# Patient Record
Sex: Female | Born: 1960 | Hispanic: Yes | Smoking: Former smoker
Health system: Southern US, Community
[De-identification: ages and names within clinical notes are randomized; demographics above are authoritative.]

## PROBLEM LIST (undated history)

## (undated) DIAGNOSIS — I10 Essential (primary) hypertension: Secondary | ICD-10-CM

## (undated) DIAGNOSIS — K219 Gastro-esophageal reflux disease without esophagitis: Secondary | ICD-10-CM

## (undated) DIAGNOSIS — K589 Irritable bowel syndrome without diarrhea: Secondary | ICD-10-CM

## (undated) DIAGNOSIS — K449 Diaphragmatic hernia without obstruction or gangrene: Secondary | ICD-10-CM

## (undated) DIAGNOSIS — U071 COVID-19: Secondary | ICD-10-CM

## (undated) DIAGNOSIS — D649 Anemia, unspecified: Secondary | ICD-10-CM

## (undated) DIAGNOSIS — T7840XA Allergy, unspecified, initial encounter: Secondary | ICD-10-CM

## (undated) DIAGNOSIS — M1990 Unspecified osteoarthritis, unspecified site: Secondary | ICD-10-CM

## (undated) DIAGNOSIS — K8020 Calculus of gallbladder without cholecystitis without obstruction: Secondary | ICD-10-CM

## (undated) HISTORY — DX: Calculus of gallbladder without cholecystitis without obstruction: K80.20

## (undated) HISTORY — PX: ROOT CANAL: SHX2363

## (undated) HISTORY — DX: Diaphragmatic hernia without obstruction or gangrene: K44.9

## (undated) HISTORY — PX: ABDOMINAL HYSTERECTOMY: SHX81

## (undated) HISTORY — DX: Essential (primary) hypertension: I10

## (undated) HISTORY — DX: COVID-19: U07.1

## (undated) HISTORY — DX: Anemia, unspecified: D64.9

## (undated) HISTORY — PX: TUBAL LIGATION: SHX77

## (undated) HISTORY — DX: Unspecified osteoarthritis, unspecified site: M19.90

## (undated) HISTORY — DX: Irritable bowel syndrome, unspecified: K58.9

## (undated) HISTORY — PX: CARDIAC ELECTROPHYSIOLOGY STUDY AND ABLATION: SHX1294

## (undated) HISTORY — DX: Gastro-esophageal reflux disease without esophagitis: K21.9

## (undated) HISTORY — DX: Allergy, unspecified, initial encounter: T78.40XA

---

## 2007-06-13 HISTORY — PX: TOTAL ABDOMINAL HYSTERECTOMY W/ BILATERAL SALPINGOOPHORECTOMY: SHX83

## 2016-06-17 ENCOUNTER — Encounter (HOSPITAL_COMMUNITY): Payer: Self-pay | Admitting: *Deleted

## 2016-06-17 ENCOUNTER — Emergency Department (HOSPITAL_COMMUNITY): Payer: Self-pay

## 2016-06-17 DIAGNOSIS — Z79899 Other long term (current) drug therapy: Secondary | ICD-10-CM | POA: Insufficient documentation

## 2016-06-17 DIAGNOSIS — R1013 Epigastric pain: Secondary | ICD-10-CM | POA: Insufficient documentation

## 2016-06-17 DIAGNOSIS — R002 Palpitations: Secondary | ICD-10-CM | POA: Insufficient documentation

## 2016-06-17 LAB — CBC
HCT: 39.8 % (ref 36.0–46.0)
HEMOGLOBIN: 13.1 g/dL (ref 12.0–15.0)
MCH: 30.7 pg (ref 26.0–34.0)
MCHC: 32.9 g/dL (ref 30.0–36.0)
MCV: 93.2 fL (ref 78.0–100.0)
PLATELETS: 296 10*3/uL (ref 150–400)
RBC: 4.27 MIL/uL (ref 3.87–5.11)
RDW: 13.3 % (ref 11.5–15.5)
WBC: 11.2 10*3/uL — AB (ref 4.0–10.5)

## 2016-06-17 LAB — BASIC METABOLIC PANEL
ANION GAP: 8 (ref 5–15)
BUN: 16 mg/dL (ref 6–20)
CHLORIDE: 105 mmol/L (ref 101–111)
CO2: 25 mmol/L (ref 22–32)
Calcium: 9.1 mg/dL (ref 8.9–10.3)
Creatinine, Ser: 0.85 mg/dL (ref 0.44–1.00)
GFR calc Af Amer: >60 mL/min (ref >60)
GFR calc non Af Amer: >60 mL/min (ref >60)
Glucose, Bld: 107 mg/dL — ABNORMAL HIGH (ref 65–99)
Potassium: 3.9 mmol/L (ref 3.5–5.1)
SODIUM: 138 mmol/L (ref 135–145)

## 2016-06-17 LAB — I-STAT TROPONIN, ED: Troponin i, poc: 0 ng/mL (ref 0.00–0.08)

## 2016-06-17 NOTE — ED Triage Notes (Addendum)
Pt c/o palpitations and gas x 40 mins. Reports chest pressure that is relieved with belching. Pt also feels more fatigued than usual and having difficulty staying awake.  Hx of cardiac ablation in the past. HR 79

## 2016-06-18 ENCOUNTER — Emergency Department (HOSPITAL_COMMUNITY)
Admission: EM | Admit: 2016-06-18 | Discharge: 2016-06-18 | Disposition: A | Discharge status: Home / Self Care | Payer: Self-pay | Attending: Emergency Medicine | Admitting: Emergency Medicine

## 2016-06-18 DIAGNOSIS — R1013 Epigastric pain: Secondary | ICD-10-CM

## 2016-06-18 DIAGNOSIS — R002 Palpitations: Secondary | ICD-10-CM

## 2016-06-18 NOTE — ED Provider Notes (Signed)
[facility]-EMERGENCY DEPT Provider Note   CSN: [id number] Arrival date & time: 06/17/16  2235     History   Chief Complaint Chief Complaint  Patient presents with  . Palpitations    HPI Gail Turner is a 56 y.o. female.  Patient presents with sense of palpitations and mild SOB earlier this evening. No chest pain, nausea, cough, fever. She reports that since eating earlier she has had epigastric discomfort and excessive belching. She feels her symptoms of palpitations, and epigastric discomfort were improved when she belched. She was on Zegrid in the past but stopped taking it when she became asymptomatic, and that current symptoms are similar. She had an ablation in the past  And was concerned about her heart, prompting ED visit. She is not having any symptoms now and has not in the past 2 hours.   The history is provided by the patient. No language interpreter was used.  Palpitations   Associated symptoms include abdominal pain (See HPI.) and shortness of breath. Pertinent negatives include no fever, no nausea and no vomiting.    History reviewed. No pertinent past medical history.  There are no active problems to display for this patient.   Past Surgical History:  Procedure Laterality Date  . ABDOMINAL HYSTERECTOMY    . ABLATION    . CESAREAN SECTION      OB History    No data available       Home Medications    Prior to Admission medications   Medication Sig Start Date End Date Taking? Authorizing Provider  omega-3 acid ethyl esters (LOVAZA) 1 g capsule Take 1 g by mouth daily.   Yes Historical Provider, MD  Prenatal Vit-Fe Fumarate-FA (PRENATAL MULTIVITAMIN) TABS tablet Take 1 tablet by mouth daily at 12 noon.   Yes Historical Provider, MD    Family History No family history on file.  Social History Social History  Substance Use Topics  . Smoking status: Never Smoker  . Smokeless tobacco: Never Used  . Alcohol use Yes     Allergies   Patient has no  known allergies.   Review of Systems Review of Systems  Constitutional: Negative for chills and fever.  HENT: Negative.   Respiratory: Positive for shortness of breath.   Cardiovascular: Positive for palpitations.  Gastrointestinal: Positive for abdominal pain (See HPI.). Negative for nausea and vomiting.  Musculoskeletal: Negative.   Skin: Negative.   Neurological: Negative.      Physical Exam Updated Vital Signs BP 121/74   Pulse 61   Temp 97.6 F (36.4 C) (Oral)   Resp (!) 22   SpO2 94%   Physical Exam  Constitutional: She is oriented to person, place, and time. She appears well-developed and well-nourished.  HENT:  Head: Normocephalic.  Neck: Normal range of motion. Neck supple.  Cardiovascular: Normal rate and regular rhythm.   Pulmonary/Chest: Effort normal and breath sounds normal. She has no wheezes. She has no rales. She exhibits no tenderness.  Abdominal: Soft. Bowel sounds are normal. There is no tenderness. There is no rebound and no guarding.  Musculoskeletal: Normal range of motion. She exhibits no edema.  Neurological: She is alert and oriented to person, place, and time.  Skin: Skin is warm and dry. No rash noted.  Psychiatric: She has a normal mood and affect.     ED Treatments / Results  Labs (all labs ordered are listed, but only abnormal results are displayed) Labs Reviewed  BASIC METABOLIC PANEL - Abnormal; Notable for the  following:       Result Value   Glucose, Bld 107 (*)    All other components within normal limits  CBC - Abnormal; Notable for the following:    WBC 11.2 (*)    All other components within normal limits  I-STAT TROPOININ, ED    EKG  EKG Interpretation  Date/Time:  Friday June 17 2016 22:41:33 EDT Ventricular Rate:  78 PR Interval:  176 QRS Duration: 98 QT Interval:  406 QTC Calculation: 462 R Axis:   82 Text Interpretation:  Normal sinus rhythm Incomplete right bundle branch block Borderline ECG No previous ECGs  available Confirmed by Bebe Shaggy  MD, DONALD ([id number]) on 06/18/2016 1:55:17 AM       Radiology Dg Chest 2 View  Result Date: 06/17/2016 CLINICAL DATA:  Chest palpitations, pressure and gas. EXAM: CHEST  2 VIEW COMPARISON:  None. FINDINGS: The heart size and mediastinal contours are within normal limits. Both lungs are clear. The visualized skeletal structures are unremarkable. IMPRESSION: No active cardiopulmonary disease. Electronically Signed   By: Tollie Eth M.D.   On: 06/17/2016 22:57    Procedures Procedures (including critical care time)  Medications Ordered in ED Medications - No data to display   Initial Impression / Assessment and Plan / ED Course  I have reviewed the triage vital signs and the nursing notes.  Pertinent labs & imaging results that were available during my care of the patient were reviewed by me and considered in my medical decision making (see chart for details).     1. Dyspepsia 2. Palpitations  Patient with symptoms chest discomfort relieved with belching. Labs, EKG, CXR reassuring. No history heart disease. Had been on Zegerid in the past for reflux - still has medication at home.   Discussed with Dr. Bebe Shaggy who feels the patient can be discharged home. Will recommend she restart Zegerid and follow up with PCP. Return precautions discussed.   Final Clinical Impressions(s) / ED Diagnoses   Final diagnoses:  None    New Prescriptions New Prescriptions   No medications on file     Danne Harbor 06/29/16 2153    Zadie Rhine, MD 07/01/16 [phone]

## 2016-06-18 NOTE — Discharge Instructions (Signed)
Restart your Zegerid daily. Follow up with your primary care physician if symptoms persist. Return to the emergency department any time you feel concerned about symptoms.

## 2016-06-18 NOTE — ED Notes (Signed)
ED Provider at bedside. 

## 2017-05-18 ENCOUNTER — Emergency Department (HOSPITAL_COMMUNITY)
Admission: EM | Admit: 2017-05-18 | Discharge: 2017-05-19 | Disposition: A | Discharge status: Home / Self Care | Payer: Self-pay | Attending: Emergency Medicine | Admitting: Emergency Medicine

## 2017-05-18 ENCOUNTER — Encounter (HOSPITAL_COMMUNITY): Payer: Self-pay | Admitting: Emergency Medicine

## 2017-05-18 DIAGNOSIS — R079 Chest pain, unspecified: Secondary | ICD-10-CM | POA: Insufficient documentation

## 2017-05-18 DIAGNOSIS — Z5321 Procedure and treatment not carried out due to patient leaving prior to being seen by health care provider: Secondary | ICD-10-CM | POA: Insufficient documentation

## 2017-05-18 NOTE — ED Triage Notes (Signed)
Patient reports central chest pain with mild SOB and nausea onset this evening , denies emesis or diaphoresis .

## 2017-05-19 ENCOUNTER — Emergency Department (HOSPITAL_COMMUNITY): Payer: Self-pay

## 2017-05-19 LAB — BASIC METABOLIC PANEL
ANION GAP: 10 (ref 5–15)
BUN: 18 mg/dL (ref 6–20)
CO2: 23 mmol/L (ref 22–32)
Calcium: 9 mg/dL (ref 8.9–10.3)
Chloride: 107 mmol/L (ref 101–111)
Creatinine, Ser: 0.9 mg/dL (ref 0.44–1.00)
GFR calc Af Amer: >60 mL/min (ref >60)
GLUCOSE: 145 mg/dL — AB (ref 65–99)
POTASSIUM: 3.5 mmol/L (ref 3.5–5.1)
Sodium: 140 mmol/L (ref 135–145)

## 2017-05-19 LAB — CBC
HEMATOCRIT: 41.4 % (ref 36.0–46.0)
HEMOGLOBIN: 13.7 g/dL (ref 12.0–15.0)
MCH: 30.6 pg (ref 26.0–34.0)
MCHC: 33.1 g/dL (ref 30.0–36.0)
MCV: 92.6 fL (ref 78.0–100.0)
Platelets: 307 10*3/uL (ref 150–400)
RBC: 4.47 MIL/uL (ref 3.87–5.11)
RDW: 13.2 % (ref 11.5–15.5)
WBC: 11.4 10*3/uL — ABNORMAL HIGH (ref 4.0–10.5)

## 2017-05-19 LAB — I-STAT TROPONIN, ED: Troponin i, poc: 0 ng/mL (ref 0.00–0.08)

## 2017-05-19 NOTE — ED Notes (Signed)
Pt stated that she could not stay, handed registration her stickers and walked out.

## 2017-07-15 ENCOUNTER — Emergency Department (HOSPITAL_COMMUNITY): Payer: Self-pay

## 2017-07-15 ENCOUNTER — Emergency Department (HOSPITAL_COMMUNITY)
Admission: EM | Admit: 2017-07-15 | Discharge: 2017-07-15 | Disposition: A | Discharge status: Home / Self Care | Payer: Self-pay | Attending: Emergency Medicine | Admitting: Emergency Medicine

## 2017-07-15 ENCOUNTER — Encounter (HOSPITAL_COMMUNITY): Payer: Self-pay

## 2017-07-15 ENCOUNTER — Other Ambulatory Visit: Payer: Self-pay

## 2017-07-15 DIAGNOSIS — R1011 Right upper quadrant pain: Secondary | ICD-10-CM | POA: Insufficient documentation

## 2017-07-15 DIAGNOSIS — R1013 Epigastric pain: Secondary | ICD-10-CM | POA: Insufficient documentation

## 2017-07-15 DIAGNOSIS — Z79899 Other long term (current) drug therapy: Secondary | ICD-10-CM | POA: Insufficient documentation

## 2017-07-15 DIAGNOSIS — R1010 Upper abdominal pain, unspecified: Secondary | ICD-10-CM

## 2017-07-15 DIAGNOSIS — R109 Unspecified abdominal pain: Secondary | ICD-10-CM

## 2017-07-15 LAB — CBC
HEMATOCRIT: 43.1 % (ref 36.0–46.0)
HEMOGLOBIN: 14.4 g/dL (ref 12.0–15.0)
MCH: 31.6 pg (ref 26.0–34.0)
MCHC: 33.4 g/dL (ref 30.0–36.0)
MCV: 94.5 fL (ref 78.0–100.0)
Platelets: 305 10*3/uL (ref 150–400)
RBC: 4.56 MIL/uL (ref 3.87–5.11)
RDW: 13.2 % (ref 11.5–15.5)
WBC: 9.4 10*3/uL (ref 4.0–10.5)

## 2017-07-15 LAB — URINALYSIS, ROUTINE W REFLEX MICROSCOPIC
Bilirubin Urine: NEGATIVE
GLUCOSE, UA: NEGATIVE mg/dL
HGB URINE DIPSTICK: NEGATIVE
Ketones, ur: NEGATIVE mg/dL
LEUKOCYTES UA: NEGATIVE
Nitrite: NEGATIVE
PH: 7 (ref 5.0–8.0)
Protein, ur: NEGATIVE mg/dL
SPECIFIC GRAVITY, URINE: 1.001 — AB (ref 1.005–1.030)

## 2017-07-15 LAB — I-STAT BETA HCG BLOOD, ED (MC, WL, AP ONLY): I-stat hCG, quantitative: <5 m[IU]/mL (ref <5)

## 2017-07-15 LAB — COMPREHENSIVE METABOLIC PANEL
ALBUMIN: 3.9 g/dL (ref 3.5–5.0)
ALT: 20 U/L (ref 14–54)
ANION GAP: 10 (ref 5–15)
AST: 24 U/L (ref 15–41)
Alkaline Phosphatase: 77 U/L (ref 38–126)
BILIRUBIN TOTAL: 0.7 mg/dL (ref 0.3–1.2)
BUN: 13 mg/dL (ref 6–20)
CALCIUM: 9.6 mg/dL (ref 8.9–10.3)
CO2: 26 mmol/L (ref 22–32)
Chloride: 103 mmol/L (ref 101–111)
Creatinine, Ser: 0.8 mg/dL (ref 0.44–1.00)
GFR calc Af Amer: >60 mL/min (ref >60)
GFR calc non Af Amer: >60 mL/min (ref >60)
GLUCOSE: 104 mg/dL — AB (ref 65–99)
Potassium: 3.9 mmol/L (ref 3.5–5.1)
Sodium: 139 mmol/L (ref 135–145)
TOTAL PROTEIN: 7.5 g/dL (ref 6.5–8.1)

## 2017-07-15 LAB — LIPASE, BLOOD: Lipase: 87 U/L — ABNORMAL HIGH (ref 11–51)

## 2017-07-15 MED ORDER — ONDANSETRON 4 MG PO TBDP
4.0000 mg | ORAL_TABLET | Freq: Once | ORAL | Status: AC | PRN
  Administered 2017-07-15: 4 mg via ORAL
  Filled 2017-07-15: qty 1

## 2017-07-15 MED ORDER — ONDANSETRON 4 MG PO TBDP
4.0000 mg | ORAL_TABLET | Freq: Three times a day (TID) | ORAL | 0 refills | Status: AC | PRN
Start: 2017-07-15 — End: 2017-07-18

## 2017-07-15 NOTE — ED Provider Notes (Signed)
[facility]-EMERGENCY DEPT Provider Note  CSN: [id number] Arrival date & time: 07/15/17 1109  Chief Complaint(s) Abdominal Pain  HPI Gail Turner is a 57 y.o. female   The history is provided by the patient.  Abdominal Pain   This is a new problem. The current episode started 6 to 12 hours ago. The problem occurs constantly. The problem has been gradually improving. The pain is associated with an unknown factor. The pain is located in the generalized abdominal region. The quality of the pain is sharp and pressure-like. The pain is moderate. Associated symptoms include nausea. Pertinent negatives include fever, diarrhea, hematochezia, melena, vomiting, constipation and dysuria. The symptoms are aggravated by certain positions. The symptoms are relieved by bowel activity.    Past Medical History No past medical history on file. There are no active problems to display for this patient.  Home Medication(s) Prior to Admission medications   Medication Sig Start Date End Date Taking? Authorizing Provider  b complex vitamins tablet Take 1 tablet by mouth daily.   Yes [provider]  calcium carbonate (TUMS - DOSED IN MG ELEMENTAL CALCIUM) 500 MG chewable tablet Chew 1 tablet by mouth 2 (two) times daily as needed for indigestion or heartburn.   Yes [provider]  omega-3 acid ethyl esters (LOVAZA) 1 g capsule Take 1 g by mouth daily.   Yes [provider]  Prenatal Vit-Fe Fumarate-FA (PRENATAL MULTIVITAMIN) TABS tablet Take 1 tablet by mouth daily at 12 noon.   Yes [provider]  ondansetron (ZOFRAN ODT) 4 MG disintegrating tablet Take 1 tablet (4 mg total) by mouth every 8 (eight) hours as needed for up to 3 days for nausea or vomiting. 07/15/17 07/18/17  Shondell Poulson, Amadeo Garnet, MD                                                                                                                                    Past Surgical  History Past Surgical History:  Procedure Laterality Date  . ABDOMINAL HYSTERECTOMY    . ABLATION    . CESAREAN SECTION     Family History No family history on file.  Social History Social History   Tobacco Use  . Smoking status: Never Smoker  . Smokeless tobacco: Never Used  Substance Use Topics  . Alcohol use: Yes  . Drug use: No   Allergies Amoxicillin  Review of Systems Review of Systems  Constitutional: Negative for fever.  Gastrointestinal: Positive for abdominal pain and nausea. Negative for constipation, diarrhea, hematochezia, melena and vomiting.  Genitourinary: Negative for dysuria.   All other systems are reviewed and are negative for acute change except as noted in the HPI  Physical Exam Vital Signs  I have reviewed the triage vital signs BP (!) 144/87 (BP Location: Right Arm)   Pulse 62   Temp 98.4 F (36.9 C) (Oral)   Resp 17   Ht 5'  4" (1.626 m)   Wt 90.7 kg (200 lb)   SpO2 99%   BMI 34.33 kg/m   Physical Exam  Constitutional: She is oriented to person, place, and time. She appears well-developed and well-nourished. No distress.  HENT:  Head: Normocephalic and atraumatic.  Right Ear: External ear normal.  Left Ear: External ear normal.  Nose: Nose normal.  Eyes: Conjunctivae and EOM are normal. No scleral icterus.  Neck: Normal range of motion and phonation normal.  Cardiovascular: Normal rate and regular rhythm.  Pulmonary/Chest: Effort normal. No stridor. No respiratory distress.  Abdominal: She exhibits distension. There is tenderness in the right upper quadrant and epigastric area. There is no rigidity, no rebound, no guarding and no CVA tenderness.  Musculoskeletal: Normal range of motion. She exhibits no edema.  Neurological: She is alert and oriented to person, place, and time.  Skin: She is not diaphoretic.  Psychiatric: She has a normal mood and affect. Her behavior is normal.  Vitals reviewed.   ED Results and  Treatments Labs (all labs ordered are listed, but only abnormal results are displayed) Labs Reviewed  LIPASE, BLOOD - Abnormal; Notable for the following components:      Result Value   Lipase 87 (*)    All other components within normal limits  COMPREHENSIVE METABOLIC PANEL - Abnormal; Notable for the following components:   Glucose, Bld 104 (*)    All other components within normal limits  URINALYSIS, ROUTINE W REFLEX MICROSCOPIC - Abnormal; Notable for the following components:   Color, Urine COLORLESS (*)    Specific Gravity, Urine 1.001 (*)    All other components within normal limits  CBC  I-STAT BETA HCG BLOOD, ED ([facility], [facility], [facility] ONLY)                                                                                                                         EKG  EKG Interpretation  Date/Time:    Ventricular Rate:    PR Interval:    QRS Duration:   QT Interval:    QTC Calculation:   R Axis:     Text Interpretation:        Radiology Dg Abd Acute W/chest  Result Date: 07/15/2017 CLINICAL DATA:  Per order- abd pain Pt states that she is having generalized abd pain. Pt states pressure from groin to chest. Pt states relief with burps. PT HX: hiatal hernia, non smoker, c-section EXAM: DG ABDOMEN ACUTE W/ 1V CHEST COMPARISON:  05/19/2017 FINDINGS: There is no evidence of dilated bowel loops or free intraperitoneal air. No radiopaque calculi or other significant radiographic abnormality is seen. Heart size and mediastinal contours are within normal limits. Both lungs are clear. There are mild degenerative changes in the LOWER lumbar spine. IMPRESSION: No evidence for acute  abnormality. Electronically Signed   By: Norva Pavlov M.D.   On: 07/15/2017 17:45   US Abdomen Limited Ruq  Result Date: 07/15/2017 CLINICAL DATA:  RIGHT UPPER QUADRANT  pain for 1 day. Prior hysterectomy. EXAM: ULTRASOUND ABDOMEN LIMITED RIGHT UPPER QUADRANT COMPARISON:  07/15/2017 plain films FINDINGS:  Gallbladder: Numerous gallstones are identified in the gallbladder, largest measuring approximately 1.2 centimeters. Gallbladder sludge is also present. Gallbladder wall is normal in thickness, 2.7 millimeters. No pericholecystic fluid or sonographic Murphy's sign. Common bile duct: Diameter: 3.0 millimeters Liver: Heterogeneous appearance of the liver. No focal liver lesions. Portal vein is patent on color Doppler imaging with normal direction of blood flow towards the liver. IMPRESSION: Cholelithiasis. No other sonographic evidence for acute cholecystitis. Heterogeneous appearance of the liver. Electronically Signed   By: Norva Pavlov M.D.   On: 07/15/2017 19:52   Pertinent labs & imaging results that were available during my care of the patient were reviewed by me and considered in my medical decision making (see chart for details).  Medications Ordered in ED Medications  ondansetron (ZOFRAN-ODT) disintegrating tablet 4 mg (4 mg Oral Given 07/15/17 1201)                                                                                                                                    Procedures Procedures  EMERGENCY DEPARTMENT BILIARY ULTRASOUND INTERPRETATION "Study: Limited Abdominal Ultrasound of the Gallbladder and Common Bile Duct."  INDICATIONS: Abdominal pain Indication: Multiple views of the gallbladder and common bile duct were obtained in real-time with a Multi-frequency probe."  PERFORMED BY:  Myself IMAGES ARCHIVED?: Yes LIMITATIONS: Body habitus INTERPRETATION: Cholelithiasis, Gallbladder wall normal in thickness and Sonographic Murphy's sign abscent   (including critical care time)  Medical Decision Making / ED Course I have reviewed the nursing notes for this encounter and the patient's prior records (if available in EHR or on provided paperwork).    Epigastric/RUQ abd pain and discomfort. POCUS with with Coley lithiasis.  No evidence of pericholecystic fluid  concerning for acute cholecystitis.  Labs grossly reassuring without leukocytosis, significant electrolyte derangements or transaminitis.  Lipase mildly elevated.  Sent for formal ultrasound to assess for possible choledocholithiasis which was unremarkable.  Acute abdominal series  not suggestive of bowel obstruction.  Patient was treated symptomatically with antiemetics and IV fluids.  Able to tolerate oral hydration.  On repeat abdominal examination, pain and tenderness had improved.  Low suspicion for serious intra-abdominal inflammatory/infectious process.  The patient appears reasonably screened and/or stabilized for discharge and I doubt any other medical condition or other [facility] requiring further screening, evaluation, or treatment in the ED at this time prior to discharge.  The patient is safe for discharge with strict return precautions.   Final Clinical Impression(s) / ED Diagnoses Final diagnoses:  Abdominal pain  RUQ pain  Pain of upper abdomen    Disposition: Discharge  Condition: Good  I have discussed the results, Dx and Tx plan with the patient who expressed understanding and agree(s) with the plan. Discharge instructions discussed at great length. The patient was given strict return precautions who  verbalized understanding of the instructions. No further questions at time of discharge.    ED Discharge Orders        Ordered    ondansetron (ZOFRAN ODT) 4 MG disintegrating tablet  Every 8 hours PRN     07/15/17 2044       Follow Up: Primary care provider   If you do not have a primary care physician, contact [facility] at [phone] for referral     This chart was dictated using voice recognition software.  Despite best efforts to proofread,  errors can occur which can change the documentation meaning.   Nira Conn, MD 07/15/17 2044

## 2017-07-15 NOTE — ED Triage Notes (Signed)
Hx of gastritis per pt. Recently dx with hiatal hernia.  Pt states that she is having new abd pain. Pt states air in abdomen, difficulty pulling on pants. Pt states pressure from groin to chest. Pt states relief with burps. Pt states also lower extremity swelling

## 2017-07-19 ENCOUNTER — Emergency Department (HOSPITAL_COMMUNITY): Payer: Self-pay

## 2017-07-19 ENCOUNTER — Emergency Department (HOSPITAL_COMMUNITY)
Admission: EM | Admit: 2017-07-19 | Discharge: 2017-07-19 | Disposition: A | Discharge status: Home / Self Care | Payer: Self-pay | Attending: Emergency Medicine | Admitting: Emergency Medicine

## 2017-07-19 ENCOUNTER — Encounter (HOSPITAL_COMMUNITY): Payer: Self-pay | Admitting: Emergency Medicine

## 2017-07-19 DIAGNOSIS — Z79899 Other long term (current) drug therapy: Secondary | ICD-10-CM | POA: Insufficient documentation

## 2017-07-19 DIAGNOSIS — K8020 Calculus of gallbladder without cholecystitis without obstruction: Secondary | ICD-10-CM | POA: Insufficient documentation

## 2017-07-19 DIAGNOSIS — R1084 Generalized abdominal pain: Secondary | ICD-10-CM

## 2017-07-19 LAB — CBC
HCT: 43.7 % (ref 36.0–46.0)
Hemoglobin: 14.7 g/dL (ref 12.0–15.0)
MCH: 31.6 pg (ref 26.0–34.0)
MCHC: 33.6 g/dL (ref 30.0–36.0)
MCV: 94 fL (ref 78.0–100.0)
PLATELETS: 300 10*3/uL (ref 150–400)
RBC: 4.65 MIL/uL (ref 3.87–5.11)
RDW: 13 % (ref 11.5–15.5)
WBC: 9.2 10*3/uL (ref 4.0–10.5)

## 2017-07-19 LAB — COMPREHENSIVE METABOLIC PANEL
ALT: 20 U/L (ref 14–54)
AST: 20 U/L (ref 15–41)
Albumin: 4.2 g/dL (ref 3.5–5.0)
Alkaline Phosphatase: 75 U/L (ref 38–126)
Anion gap: 10 (ref 5–15)
BUN: 12 mg/dL (ref 6–20)
CO2: 27 mmol/L (ref 22–32)
Calcium: 9.2 mg/dL (ref 8.9–10.3)
Chloride: 105 mmol/L (ref 101–111)
Creatinine, Ser: 0.78 mg/dL (ref 0.44–1.00)
GFR calc non Af Amer: >60 mL/min (ref >60)
Glucose, Bld: 99 mg/dL (ref 65–99)
Potassium: 3.9 mmol/L (ref 3.5–5.1)
SODIUM: 142 mmol/L (ref 135–145)
Total Bilirubin: 0.4 mg/dL (ref 0.3–1.2)
Total Protein: 8 g/dL (ref 6.5–8.1)

## 2017-07-19 LAB — URINALYSIS, ROUTINE W REFLEX MICROSCOPIC
BILIRUBIN URINE: NEGATIVE
Glucose, UA: NEGATIVE mg/dL
HGB URINE DIPSTICK: NEGATIVE
Ketones, ur: NEGATIVE mg/dL
Leukocytes, UA: NEGATIVE
Nitrite: NEGATIVE
PROTEIN: NEGATIVE mg/dL
Specific Gravity, Urine: 1.029 (ref 1.005–1.030)
pH: 7 (ref 5.0–8.0)

## 2017-07-19 LAB — LIPASE, BLOOD: Lipase: 81 U/L — ABNORMAL HIGH (ref 11–51)

## 2017-07-19 MED ORDER — IOPAMIDOL (ISOVUE-300) INJECTION 61%
100.0000 mL | Freq: Once | INTRAVENOUS | Status: AC | PRN
  Administered 2017-07-19: 100 mL via INTRAVENOUS

## 2017-07-19 MED ORDER — IOPAMIDOL (ISOVUE-300) INJECTION 61%
INTRAVENOUS | Status: AC
  Filled 2017-07-19: qty 100

## 2017-07-19 MED ORDER — HYDROMORPHONE HCL 1 MG/ML IJ SOLN: 1.0000 mg | Freq: Once | INTRAMUSCULAR | Status: DC

## 2017-07-19 MED ORDER — HYDROCODONE-ACETAMINOPHEN 5-325 MG PO TABS: 1.0000 | ORAL_TABLET | Freq: Four times a day (QID) | ORAL | 0 refills | Status: DC | PRN

## 2017-07-19 MED ORDER — ONDANSETRON HCL 4 MG/2ML IJ SOLN
4.0000 mg | Freq: Once | INTRAMUSCULAR | Status: AC
  Administered 2017-07-19: 4 mg via INTRAVENOUS
  Filled 2017-07-19: qty 2

## 2017-07-19 MED ORDER — HYDROMORPHONE HCL 1 MG/ML IJ SOLN
0.5000 mg | Freq: Once | INTRAMUSCULAR | Status: AC
  Administered 2017-07-19: 0.5 mg via INTRAVENOUS
  Filled 2017-07-19: qty 1

## 2017-07-19 MED ORDER — SODIUM CHLORIDE 0.9 % IV SOLN
INTRAVENOUS | Status: DC
Start: 2017-07-19 — End: 2017-07-19
  Administered 2017-07-19: 13:00:00 via INTRAVENOUS

## 2017-07-19 NOTE — ED Notes (Signed)
Bed: ZO10 Expected date:  Expected time:  Means of arrival:  Comments: Held

## 2017-07-19 NOTE — Discharge Instructions (Signed)
Take the pain medication and antinausea medicine you were given earlier for any recurrent pain.  Today's work-up shows persistent cholelithiasis without evidence of any infection of the gallbladder.  Also no evidence of gallbladder obstruction.  It is possible your abdominal pain may be related to that.  But since the duration of the pain is been long would expect some additional changes on the gallbladder.  CT scan showed no other problems.  Your lipase has been elevated slightly at the last 2 visits.  But CT scan showed no evidence of any significant problem with the pancreas.  Would recommend he make an appointment with general surgery [facility] to determine if the gallbladder needs to be removed.  Would also recommend that you follow-up with gastroenterology call and make an appointment with them for them to evaluate any other potential causes of your abdominal pain.  Return for any new or worse symptoms.

## 2017-07-19 NOTE — ED Triage Notes (Signed)
Pt reports that she was here on Saturday for abd pains and told has gall stones. Repots last night pains got worse. Only sent  Home with Zofran and nothing for pain. Denies diarrhea or urinary problems.

## 2017-07-19 NOTE — ED Provider Notes (Signed)
[facility]-EMERGENCY DEPT Provider Note   CSN: [id number] Arrival date & time: 07/19/17  1042     History   Chief Complaint Chief Complaint  Patient presents with  . Abdominal Pain    HPI Gail Turner is a 57 y.o. female.  Patient seen in the emergency department on May 11.  Patient is been having abdominal pain occasionally more on the right side.  Now for several weeks.  At that time she had an ultrasound done that showed gallstones.  But no evidence of gallbladder infection.  Patient was persistent discomfort.  Patient treated with antinausea medicine.  Not taking any pain medications at home.  Pain got worse last night.  Patient states it seems to be more bilateral periumbilical area occasionally sharp pain right upper quadrant even drinking water makes it worse.  No diarrhea no fevers.  Patient's appetite is been decreased.  Patient's labs on her visit from the May 11 were essentially normal other than an isolated mildly elevated lipase.     History reviewed. No pertinent past medical history.  There are no active problems to display for this patient.   Past Surgical History:  Procedure Laterality Date  . ABDOMINAL HYSTERECTOMY    . ABLATION    . CESAREAN SECTION       OB History   None      Home Medications    Prior to Admission medications   Medication Sig Start Date End Date Taking? Authorizing Provider  b complex vitamins tablet Take 1 tablet by mouth daily.   Yes [provider]  calcium carbonate (TUMS - DOSED IN MG ELEMENTAL CALCIUM) 500 MG chewable tablet Chew 1 tablet by mouth 2 (two) times daily as needed for indigestion or heartburn.   Yes [provider]  omega-3 acid ethyl esters (LOVAZA) 1 g capsule Take 1 g by mouth daily.   Yes [provider]  Prenatal Vit-Fe Fumarate-FA (PRENATAL MULTIVITAMIN) TABS tablet Take 1 tablet by mouth daily at 12 noon.   Yes [provider]    HYDROcodone-acetaminophen (NORCO/VICODIN) 5-325 MG tablet Take 1-2 tablets by mouth every 6 (six) hours as needed for moderate pain. 07/19/17   Vanetta Mulders, MD    Family History No family history on file.  Social History Social History   Tobacco Use  . Smoking status: Never Smoker  . Smokeless tobacco: Never Used  Substance Use Topics  . Alcohol use: Yes  . Drug use: No     Allergies   Amoxicillin   Review of Systems Review of Systems  Constitutional: Positive for appetite change. Negative for fever.  HENT: Negative for congestion.   Eyes: Negative for redness.  Respiratory: Negative for shortness of breath.   Cardiovascular: Negative for chest pain.  Gastrointestinal: Positive for abdominal pain and nausea. Negative for diarrhea and vomiting.  Genitourinary: Negative for dysuria.  Musculoskeletal: Negative for back pain.  Skin: Negative for rash.  Neurological: Negative for headaches.  Hematological: Does not bruise/bleed easily.  Psychiatric/Behavioral: Negative for confusion.     Physical Exam Updated Vital Signs BP (!) 147/83 (BP Location: Right Arm)   Pulse 64   Temp 98.1 F (36.7 C) (Oral)   Resp 15   Ht 1.638 m (5' 4.5")   Wt 99.8 kg (220 lb)   SpO2 100%   BMI 37.18 kg/m   Physical Exam  Constitutional: She is oriented to person, place, and time. She appears well-developed and well-nourished. No distress.  HENT:  Head:  Normocephalic and atraumatic.  Mouth/Throat: Oropharynx is clear and moist.  Eyes: Pupils are equal, round, and reactive to light. Conjunctivae and EOM are normal.  Neck: Neck supple.  Cardiovascular: Normal rate and regular rhythm.  Pulmonary/Chest: Effort normal and breath sounds normal. No respiratory distress.  Abdominal: Soft. Bowel sounds are normal. There is no tenderness. There is no guarding.  Musculoskeletal: Normal range of motion.  Neurological: She is alert and oriented to person, place, and time. No cranial nerve  deficit or sensory deficit. She exhibits normal muscle tone. Coordination normal.  Skin: Skin is warm.  Nursing note and vitals reviewed.    ED Treatments / Results  Labs (all labs ordered are listed, but only abnormal results are displayed) Labs Reviewed  LIPASE, BLOOD - Abnormal; Notable for the following components:      Result Value   Lipase 81 (*)    All other components within normal limits  COMPREHENSIVE METABOLIC PANEL  CBC  URINALYSIS, ROUTINE W REFLEX MICROSCOPIC    EKG None  Radiology Ct Abdomen Pelvis W Contrast  Result Date: 07/19/2017 CLINICAL DATA:  Abdominal pain diffusely, worse last night EXAM: CT ABDOMEN AND PELVIS WITH CONTRAST TECHNIQUE: Multidetector CT imaging of the abdomen and pelvis was performed using the standard protocol following bolus administration of intravenous contrast. CONTRAST:  ISOVUE-300 IOPAMIDOL (ISOVUE-300) INJECTION 61% COMPARISON:  Ultrasound the abdomen of 07/15/2017 FINDINGS: Lower chest: The lung bases are clear. The heart is within normal limits in size. There does appear to be a small hiatal hernia present. Hepatobiliary: The liver somewhat inhomogeneous in attenuation suggesting hepatic steatosis with some sparing within the left lobe near the gallbladder. There are multiple gallstones within the gallbladder, 1 of the largest stones measuring 14 mm in diameter. No gallbladder wall thickening or pericholecystic fluid is seen. Pancreas: The pancreas is normal in size and the pancreatic duct is not dilated. Spleen: The spleen is unremarkable. Adrenals/Urinary Tract: The adrenal glands appear normal. The kidneys enhance with no calculus or mass. On delayed images the pelvocaliceal systems are unremarkable and parapelvic cysts are noted particularly on the left. The ureters appear normal in caliber. The urinary bladder is unremarkable. Stomach/Bowel: The stomach is largely decompressed. No small bowel abnormality is seen. No abnormality of  the colon is noted. The appendix and terminal ileum are unremarkable. Vascular/Lymphatic: The abdominal aorta is normal in caliber. No adenopathy is seen. Reproductive: The uterus has previously been resected. No adnexal lesion is seen. No fluid is noted within the pelvis. Other: No abdominal wall hernia is seen. Musculoskeletal: The lumbar vertebrae are in normal alignment. There is diffuse degenerative change throughout the facet joints of the lower lumbar spine with mild degenerative disc disease at L4-5 and L5-S1. IMPRESSION: 1. There are gallstones within the gallbladder but no CT evidence of acute cholecystitis is seen. 2. Small hiatal hernia. 3. Suspect hepatic steatosis with some sparing within the left lobe. Correlate with LFTs. 4. No renal or ureteral calculi are seen. Electronically Signed   By: Dwyane Dee M.D.   On: 07/19/2017 14:17    Procedures Procedures (including critical care time)  Medications Ordered in ED Medications  0.9 %  sodium chloride infusion ( Intravenous New Bag/Given 07/19/17 1313)  iopamidol (ISOVUE-300) 61 % injection (has no administration in time range)  ondansetron (ZOFRAN) injection 4 mg (4 mg Intravenous Given 07/19/17 1313)  HYDROmorphone (DILAUDID) injection 0.5 mg (0.5 mg Intravenous Given 07/19/17 1313)  iopamidol (ISOVUE-300) 61 % injection 100 mL (100 mLs  Intravenous Contrast Given 07/19/17 1349)     Initial Impression / Assessment and Plan / ED Course  I have reviewed the triage vital signs and the nursing notes.  Pertinent labs & imaging results that were available during my care of the patient were reviewed by me and considered in my medical decision making (see chart for details).     CT scan here today shows persistent cholelithiasis without evidence of any acute cholecystitis.  No evidence of any abnormalities around the pancreas.  However patient's labs normal with the exception of a mildly elevated lipase again today.  No leukocytosis no other  liver function test abnormalities.  Possible patient's symptoms for the past several weeks could be related to the gallstones and therefore would need to follow-up with general surgery to have a gallbladder removed.  However I do question whether the 2 were related to possible patient's abdominal pain may be caused from something else and would recommend follow-up with gastroenterology as well for consideration for upper endoscopy.  Patient will be referred to both general surgery and gastroenterology.  Patient improved here with pain medication low-dose hydromorphone.  Patient will be sent home with hydrocodone ensure he has Zofran take-home for nausea.  Patient will return for any new or worse symptoms particularly fever persistent vomiting. Final Clinical Impressions(s) / ED Diagnoses   Final diagnoses:  Generalized abdominal pain  Calculus of gallbladder without cholecystitis without obstruction    ED Discharge Orders        Ordered    HYDROcodone-acetaminophen (NORCO/VICODIN) 5-325 MG tablet  Every 6 hours PRN     07/19/17 1719       Vanetta Mulders, MD 07/19/17 1727

## 2017-07-24 ENCOUNTER — Ambulatory Visit: Payer: Self-pay | Admitting: Gastroenterology

## 2017-07-24 ENCOUNTER — Encounter: Payer: Self-pay | Admitting: Gastroenterology

## 2017-07-24 VITALS — BP 160/92 | HR 76 | Ht 64.5 in | Wt 225.8 lb

## 2017-07-24 DIAGNOSIS — R142 Eructation: Secondary | ICD-10-CM

## 2017-07-24 DIAGNOSIS — K8020 Calculus of gallbladder without cholecystitis without obstruction: Secondary | ICD-10-CM

## 2017-07-24 DIAGNOSIS — R140 Abdominal distension (gaseous): Secondary | ICD-10-CM

## 2017-07-24 DIAGNOSIS — R1010 Upper abdominal pain, unspecified: Secondary | ICD-10-CM

## 2017-07-24 NOTE — Patient Instructions (Signed)
If you are age 56 or older, your body mass index should be between 23-30. Your Body mass index is 38.16 kg/m. If this is out of the aforementioned range listed, please consider follow up with your Primary Care Provider.  If you are age 72 or younger, your body mass index should be between 19-25. Your Body mass index is 38.16 kg/m. If this is out of the aformentioned range listed, please consider follow up with your Primary Care Provider.   It was a pleasure to meet you today!  Dr. Myrtie Neither

## 2017-07-24 NOTE — Progress Notes (Signed)
[facility] Consult Note:  History: Gail Turner 07/24/2017  Referring physician: Patient, No Pcp Per this patient was referred by the [facility] emergency department physician  Reason for consult/chief complaint: Abdominal Pain (has generalized "attacks" of getting bloated where she cannot sit or eat; feels lots of gas; has to go to ER; has been diagnosed with gallstones); Gastroesophageal Reflux (happens with attacks of abdominal pain but also occurs independent of pain; + belcing/burping); and change in bowels (alternating constipation/diarrhea; has been having some dark stools but has also been taking pepto bismol)   Subjective  HPI:  This is a very pleasant 57 year old woman referred after 2 recent visits to the emergency department for upper abdominal pain.  She reports years of frequent belching many times per day and periodic upper abdominal bloating.  She tends toward constipation, but generally that is regular if she has her daily coffee.  However, the reason for ED visits have been acute onset episodes of severe bandlike upper abdominal pain into the lower chest that were nonexertional.  The belching would get worse with those episodes.  There have been other episodes at home for which she did not seek medical attention.  During those, the pain is all over the upper abdomen and into the back, even standing up and moving around she cannot get comfortable.  There may be nausea with it but no vomiting.  There is been no change in bowel habits with these episodes, though the episodes are becoming more frequent in the last 6 to 9 months. She went to the emergency department by EMS on March 14, but left without being seen. She went to the emergency department on May 11, abdominal x-ray showed no bowel obstruction or radiopaque renal calculi.  Ultrasound that day showed numerous gallstones, largest 1.2 cm with sludge present as well.  CBD 3 mm, gallbladder wall  thickness normal at 2.7 mm.  No pericholecystic fluid or sonographic Murphy sign.  Heterogeneous appearance of the liver with no focal liver lesions. That day she was treated with antiemetics and discharged home.  She came back to the emergency department on May 15 with continued pain localizing to the right side.  ED physician referred the patient to both [place] and general surgery.  She has not yet made the appointment with general surgery because she felt the ED physician thought we might want to do some further testing.  ROS:  Review of Systems  Constitutional: Positive for appetite change. Negative for unexpected weight change.  HENT: Negative for mouth sores and voice change.   Eyes: Negative for pain and redness.  Respiratory: Negative for cough and shortness of breath.   Cardiovascular: Negative for chest pain and palpitations.  Genitourinary: Negative for dysuria and hematuria.  Musculoskeletal: Negative for arthralgias and myalgias.  Skin: Negative for pallor and rash.  Neurological: Negative for weakness and headaches.  Hematological: Negative for adenopathy.   Klea is frustrated by upper abdominal bloating and weight gain in the upper abdomen despite what she considers to be healthy diet and regular exercise.  Past Medical History: Past Medical History:  Diagnosis Date  . Anemia   . Gallstones      Past Surgical History: Past Surgical History:  Procedure Laterality Date  . ABDOMINAL HYSTERECTOMY    . CARDIAC ELECTROPHYSIOLOGY STUDY AND ABLATION    . CESAREAN SECTION     x 2     Family History: Family History  Problem Relation Age of Onset  . Breast  cancer Mother   . Ovarian cancer Sister   . Brain cancer Maternal Aunt   . Colon cancer Neg Hx   . Esophageal cancer Neg Hx   . Stomach cancer Neg Hx   . Pancreatic cancer Neg Hx   . Liver disease Neg Hx     Social History: Social History   Socioeconomic History  . Marital status: Legally Separated     Spouse name: Not on file  . Number of children: Not on file  . Years of education: Not on file  . Highest education level: Not on file  Occupational History  . Not on file  Social Needs  . Financial resource strain: Not on file  . Food insecurity:    Worry: Not on file    Inability: Not on file  . Transportation needs:    Medical: Not on file    Non-medical: Not on file  Tobacco Use  . Smoking status: Former Games developer  . Smokeless tobacco: Never Used  Substance and Sexual Activity  . Alcohol use: Yes    Comment: 4-5 drinks weekly  . Drug use: No  . Sexual activity: Not on file  Lifestyle  . Physical activity:    Days per week: Not on file    Minutes per session: Not on file  . Stress: Not on file  Relationships  . Social connections:    Talks on phone: Not on file    Gets together: Not on file    Attends religious service: Not on file    Active member of club or organization: Not on file    Attends meetings of clubs or organizations: Not on file    Relationship status: Not on file  Other Topics Concern  . Not on file  Social History Narrative  . Not on file    Allergies: Allergies  Allergen Reactions  . Amoxicillin Palpitations    Outpatient Meds: Current Outpatient Medications  Medication Sig Dispense Refill  . b complex vitamins tablet Take 1 tablet by mouth daily.    Marland Kitchen bismuth subsalicylate (PEPTO BISMOL) 262 MG/15ML suspension Take 30 mLs by mouth every 6 (six) hours as needed.    . calcium carbonate (TUMS - DOSED IN MG ELEMENTAL CALCIUM) 500 MG chewable tablet Chew 1 tablet by mouth 2 (two) times daily as needed for indigestion or heartburn.    Marland Kitchen HYDROcodone-acetaminophen (NORCO/VICODIN) 5-325 MG tablet Take 1-2 tablets by mouth every 6 (six) hours as needed for moderate pain. 14 tablet 0  . omega-3 acid ethyl esters (LOVAZA) 1 g capsule Take 1 g by mouth daily.    . ondansetron (ZOFRAN) 4 MG tablet Take 4 mg by mouth every 8 (eight) hours as needed for nausea  or vomiting.    . Prenatal Vit-Fe Fumarate-FA (PRENATAL MULTIVITAMIN) TABS tablet Take 1 tablet by mouth daily at 12 noon.     No current facility-administered medications for this visit.       ___________________________________________________________________ Objective   Exam:  BP (!) 160/92   Pulse 76   Ht 5' 4.5" (1.638 m)   Wt 225 lb 12.8 oz (102.4 kg)   BMI 38.16 kg/m    General: this is a(n) well-appearing woman  Eyes: sclera anicteric, no redness  ENT: oral mucosa moist without lesions, no cervical or supraclavicular lymphadenopathy, good dentition  CV: RRR without murmur, S1/S2, no JVD, no peripheral edema  Resp: clear to auscultation bilaterally, normal RR and effort noted  GI: soft, no tenderness, with active bowel  sounds. No guarding or palpable organomegaly noted.  Skin; warm and dry, no rash or jaundice noted  Neuro: awake, alert and oriented x 3. Normal gross motor function and fluent speech  Labs:  CBC Latest Ref Rng & Units 07/19/2017 07/15/2017 05/19/2017  WBC 4.0 - 10.5 K/uL 9.2 9.4 11.4(H)  Hemoglobin 12.0 - 15.0 g/dL 16.1 09.6 04.5  Hematocrit 36.0 - 46.0 % 43.7 43.1 41.4  Platelets 150 - 400 K/uL 300 305 307   CMP Latest Ref Rng & Units 07/19/2017 07/15/2017 05/19/2017  Glucose 65 - 99 mg/dL 99 409(W) 119(J)  BUN 6 - 20 mg/dL Creatinine 0.44 - 1.00 mg/dL 4.78 2.95 6.21  Sodium 135 - 145 mmol/L 142 139 140  Potassium 3.5 - 5.1 mmol/L 3.9 3.9 3.5  Chloride 101 - 111 mmol/L 105 103 107  CO2 22 - 32 mmol/L Calcium 8.9 - 10.3 mg/dL 9.2 9.6 9.0  Total Protein 6.5 - 8.1 g/dL 8.0 7.5 -  Total Bilirubin 0.3 - 1.2 mg/dL 0.4 0.7 -  Alkaline Phos 38 - 126 U/L 75 77 -  AST 15 - 41 U/L 20 24 -  ALT 14 - 54 U/L 20 20 -   Lipase normal at 81   Radiologic Studies:  [place] report as above   CTAP 07/19/17 gallstones, similar findings to [place], nothing else acute. No mass, inflammation, obstruction.  Assessment: Encounter Diagnoses    Name Primary?  Marland Kitchen Upper abdominal pain Yes  . Gallstones   . Belching   . Abdominal bloating     I think she has some long-standing mild functional symptoms of belching and bloating, but I believe her episodic acute pain episodes of biliary colic.  I do not think she has obstruction or ulcer, there is no evidence of inflammation, mass or obstruction on CT scan.  Plan:  I recommended she make an appointment with the surgeon as soon as possible and consider cholecystectomy.  I would gladly see her back as needed.  Thank you for the courtesy of this consult.  Please call me with any questions or concerns.  Charlie Pitter III

## 2017-10-04 ENCOUNTER — Emergency Department (HOSPITAL_COMMUNITY): Payer: Self-pay

## 2017-10-04 ENCOUNTER — Encounter (HOSPITAL_COMMUNITY): Payer: Self-pay

## 2017-10-04 ENCOUNTER — Other Ambulatory Visit: Payer: Self-pay

## 2017-10-04 ENCOUNTER — Emergency Department (HOSPITAL_COMMUNITY)
Admission: EM | Admit: 2017-10-04 | Discharge: 2017-10-04 | Disposition: A | Discharge status: Home / Self Care | Payer: Self-pay | Attending: Emergency Medicine | Admitting: Emergency Medicine

## 2017-10-04 DIAGNOSIS — Z87891 Personal history of nicotine dependence: Secondary | ICD-10-CM | POA: Insufficient documentation

## 2017-10-04 DIAGNOSIS — Z79899 Other long term (current) drug therapy: Secondary | ICD-10-CM | POA: Insufficient documentation

## 2017-10-04 DIAGNOSIS — R109 Unspecified abdominal pain: Secondary | ICD-10-CM | POA: Insufficient documentation

## 2017-10-04 LAB — URINALYSIS, ROUTINE W REFLEX MICROSCOPIC
BILIRUBIN URINE: NEGATIVE
GLUCOSE, UA: NEGATIVE mg/dL
HGB URINE DIPSTICK: NEGATIVE
KETONES UR: NEGATIVE mg/dL
Leukocytes, UA: NEGATIVE
Nitrite: NEGATIVE
PH: 5 (ref 5.0–8.0)
PROTEIN: NEGATIVE mg/dL
Specific Gravity, Urine: 1.011 (ref 1.005–1.030)

## 2017-10-04 LAB — CBC
HCT: 42.9 % (ref 36.0–46.0)
Hemoglobin: 14.5 g/dL (ref 12.0–15.0)
MCH: 31.3 pg (ref 26.0–34.0)
MCHC: 33.8 g/dL (ref 30.0–36.0)
MCV: 92.7 fL (ref 78.0–100.0)
PLATELETS: 350 10*3/uL (ref 150–400)
RBC: 4.63 MIL/uL (ref 3.87–5.11)
RDW: 13.5 % (ref 11.5–15.5)
WBC: 9.6 10*3/uL (ref 4.0–10.5)

## 2017-10-04 LAB — COMPREHENSIVE METABOLIC PANEL
ALBUMIN: 4.3 g/dL (ref 3.5–5.0)
ALK PHOS: 80 U/L (ref 38–126)
ALT: 25 U/L (ref 0–44)
AST: 30 U/L (ref 15–41)
Anion gap: 10 (ref 5–15)
BILIRUBIN TOTAL: 0.6 mg/dL (ref 0.3–1.2)
BUN: 15 mg/dL (ref 6–20)
CHLORIDE: 107 mmol/L (ref 98–111)
CO2: 25 mmol/L (ref 22–32)
CREATININE: 0.8 mg/dL (ref 0.44–1.00)
Calcium: 10.1 mg/dL (ref 8.9–10.3)
GFR calc Af Amer: >60 mL/min (ref >60)
GFR calc non Af Amer: >60 mL/min (ref >60)
GLUCOSE: 111 mg/dL — AB (ref 70–99)
POTASSIUM: 3.7 mmol/L (ref 3.5–5.1)
SODIUM: 142 mmol/L (ref 135–145)
Total Protein: 8 g/dL (ref 6.5–8.1)

## 2017-10-04 LAB — LIPASE, BLOOD: Lipase: 26 U/L (ref 11–51)

## 2017-10-04 LAB — I-STAT TROPONIN, ED
Troponin i, poc: 0.01 ng/mL (ref 0.00–0.08)
Troponin i, poc: 0 ng/mL (ref 0.00–0.08)

## 2017-10-04 LAB — I-STAT BETA HCG BLOOD, ED (MC, WL, AP ONLY): I-stat hCG, quantitative: <5 m[IU]/mL (ref <5)

## 2017-10-04 MED ORDER — SODIUM CHLORIDE 0.9 % IV BOLUS
1000.0000 mL | Freq: Once | INTRAVENOUS | Status: AC
  Administered 2017-10-04: 1000 mL via INTRAVENOUS

## 2017-10-04 MED ORDER — GI COCKTAIL ~~LOC~~
30.0000 mL | Freq: Once | ORAL | Status: AC
  Administered 2017-10-04: 30 mL via ORAL
  Filled 2017-10-04: qty 30

## 2017-10-04 MED ORDER — ONDANSETRON HCL 4 MG/2ML IJ SOLN: 4.0000 mg | Freq: Once | INTRAMUSCULAR | Status: DC

## 2017-10-04 MED ORDER — OMEPRAZOLE 20 MG PO CPDR: 20.0000 mg | DELAYED_RELEASE_CAPSULE | Freq: Every day | ORAL | 0 refills | Status: DC

## 2017-10-04 MED ORDER — SUCRALFATE 1 GM/10ML PO SUSP: 1.0000 g | Freq: Three times a day (TID) | ORAL | 0 refills | Status: DC

## 2017-10-04 NOTE — Discharge Instructions (Addendum)
You were seen in the emergency department today for abdominal pain.  Your lab work was reassuring.  Your EKG and your heart enzymes did not show signs concerning for a heart attack.  Your abdominal ultrasound did show that you have gallstones as well as documented fatty liver, however there is no acute infection/inflammation of your gallbladder.  It is possible that your symptoms have been caused by irritation from the gallstones, however it is also possible that they are caused by sitting on his peptic ulcer disease and/or gastroesophageal reflux disease.  We are sending you home with a few medications to try to help with this.  Omeprazole is a medication you may take daily in the morning to help prevent possible gastroesophageal reflux disease.  Carafate is a medication you may take prior to my meals and prior to bedtime to help with this as well.  We have prescribed you new medication(s) today. Discuss the medications prescribed today with your pharmacist as they can have adverse effects and interactions with your other medicines including over the counter and prescribed medications. Seek medical evaluation if you start to experience new or abnormal symptoms after taking one of these medicines, seek care immediately if you start to experience difficulty breathing, feeling of your throat closing, facial swelling, or rash as these could be indications of a more serious allergic reaction   Please follow-up with the general surgeon as well as gastroenterologist (GI doctor) as you are able financially.  Otherwise please follow-up with the [facility] providing her discharge instructions within 1 week for reevaluation.  We also like you to see the [facility] for a recheck of your blood pressure as it was elevated in the emergency department today.  Return to the ER for new or worsening symptoms including but not limited to worsening pain, fever, inability to keep fluids down,  blood in your stool, or any other concerns that you may have.

## 2017-10-04 NOTE — ED Triage Notes (Signed)
Pt states RUQ pain. Pt states nausea and vomiting as well.  Pt states BMs have been smelling "chemically". Pt states she has hx of gallstones and concerned that her gallbladder "ruptured".

## 2017-10-04 NOTE — ED Provider Notes (Signed)
[facility]-EMERGENCY DEPT Provider Note   CSN: [id number] Arrival date & time: 10/04/17  1224     History   Chief Complaint Chief Complaint  Patient presents with  . Abdominal Pain    HPI Gail Turner is a 57 y.o. female with a hx of anemia and cholelithiasis who presents to the ED with complaints of abdominal pain that started at approximately 10:00 this morning.  She states that she ate breakfast and shortly after this she developed pain to the right upper quadrant of her abdomen, pain radiates to the epigastric region as well as up into her chest.  She states pain is been waxing/waning since onset.  Currently pain is a 5 out of 10 in severity sharp with a burning sensation.  She has had associated nausea without vomiting.  She states that her stool had an abnormal smell this morning, describes this as "chemically".  There are no specific alleviating or aggravating factors to her symptoms. She states she has had similar pain in the past which has been attributed to her known cholelithiasis -she has seen general surgery, however she does not currently have insurance and cannot afford cholecystectomy at this time, she plans to have this performed when she has saved enough money or obtains insurance.  Denies fever, dyspnea, constipation, diarrhea, hematochezia, dysuria, vaginal bleeding, or vaginal discharge.  HPI  Past Medical History:  Diagnosis Date  . Anemia   . Gallstones     There are no active problems to display for this patient.   Past Surgical History:  Procedure Laterality Date  . ABDOMINAL HYSTERECTOMY    . CARDIAC ELECTROPHYSIOLOGY STUDY AND ABLATION    . CESAREAN SECTION     x 2     OB History   None      Home Medications    Prior to Admission medications   Medication Sig Start Date End Date Taking? Authorizing Provider  b complex vitamins tablet Take 1 tablet by mouth daily.   Yes [provider]  bismuth subsalicylate  (PEPTO BISMOL) 262 MG/15ML suspension Take 30 mLs by mouth every 6 (six) hours as needed.   Yes [provider]  calcium carbonate (TUMS - DOSED IN MG ELEMENTAL CALCIUM) 500 MG chewable tablet Chew 1 tablet by mouth 2 (two) times daily as needed for indigestion or heartburn.   Yes [provider]  cholecalciferol (VITAMIN D) 1000 units tablet Take 2,000 Units by mouth daily.   Yes [provider]  Flaxseed, Linseed, (FLAXSEED OIL) 1000 MG CAPS Take 1,000 mg by mouth daily.   Yes [provider]  omega-3 acid ethyl esters (LOVAZA) 1 g capsule Take 1 g by mouth daily.   Yes [provider]  ondansetron (ZOFRAN) 4 MG tablet Take 4 mg by mouth every 8 (eight) hours as needed for nausea or vomiting.   Yes [provider]  Prenatal Vit-Fe Fumarate-FA (PRENATAL MULTIVITAMIN) TABS tablet Take 1 tablet by mouth daily at 12 noon.   Yes [provider]  HYDROcodone-acetaminophen (NORCO/VICODIN) 5-325 MG tablet Take 1-2 tablets by mouth every 6 (six) hours as needed for moderate pain. Patient not taking: Reported on 10/04/2017 07/19/17   Vanetta Mulders, MD    Family History Family History  Problem Relation Age of Onset  . Breast cancer Mother   . Ovarian cancer Sister   . Brain cancer Maternal Aunt   . Colon cancer Neg Hx   . Esophageal cancer Neg Hx   . Stomach  cancer Neg Hx   . Pancreatic cancer Neg Hx   . Liver disease Neg Hx     Social History Social History   Tobacco Use  . Smoking status: Former Games developer  . Smokeless tobacco: Never Used  Substance Use Topics  . Alcohol use: Yes    Comment: 4-5 drinks weekly  . Drug use: No     Allergies   Amoxicillin   Review of Systems Review of Systems  Constitutional: Negative for chills and fever.  Respiratory: Negative for shortness of breath.   Gastrointestinal: Positive for abdominal pain and nausea. Negative for blood in stool, constipation, diarrhea and vomiting.    Genitourinary: Negative for dysuria, vaginal bleeding and vaginal discharge.  All other systems reviewed and are negative.  Physical Exam Updated Vital Signs BP (!) 186/101 (BP Location: Left Arm)   Pulse 82   Temp 98.7 F (37.1 C) (Oral)   Resp 18   Ht 5' 4.5" (1.638 m)   Wt 99.8 kg (220 lb)   SpO2 93%   BMI 37.18 kg/m   Physical Exam  Constitutional: She appears well-developed and well-nourished. No distress.  HENT:  Head: Normocephalic and atraumatic.  Eyes: Conjunctivae are normal. Right eye exhibits no discharge. Left eye exhibits no discharge.  Cardiovascular: Normal rate and regular rhythm.  No murmur heard. Pulmonary/Chest: Breath sounds normal. No respiratory distress. She has no wheezes. She has no rales.  Abdominal: Soft. She exhibits no distension. There is tenderness in the right upper quadrant and epigastric area. There is no rigidity, no rebound, no guarding, no CVA tenderness and no tenderness at McBurney's point.  Neurological: She is alert.  Clear speech.   Skin: Skin is warm and dry. No rash noted.  Psychiatric: She has a normal mood and affect. Her behavior is normal.  Nursing note and vitals reviewed.   ED Treatments / Results  Labs Results for orders placed or performed during the hospital encounter of 10/04/17  Lipase, blood  Result Value Ref Range   Lipase 26 11 - 51 U/L  Comprehensive metabolic panel  Result Value Ref Range   Sodium 142 135 - 145 mmol/L   Potassium 3.7 3.5 - 5.1 mmol/L   Chloride 107 98 - 111 mmol/L   CO2 25 22 - 32 mmol/L   Glucose, Bld 111 (H) 70 - 99 mg/dL   BUN 15 6 - 20 mg/dL   Creatinine, Ser 8.11 0.44 - 1.00 mg/dL   Calcium 91.4 8.9 - 78.2 mg/dL   Total Protein 8.0 6.5 - 8.1 g/dL   Albumin 4.3 3.5 - 5.0 g/dL   AST 30 15 - 41 U/L   ALT 25 0 - 44 U/L   Alkaline Phosphatase 80 38 - 126 U/L   Total Bilirubin 0.6 0.3 - 1.2 mg/dL   GFR calc non Af Amer >60 >60 mL/min   GFR calc Af Amer >60 >60 mL/min   Anion gap 10 5  - 15  CBC  Result Value Ref Range   WBC 9.6 4.0 - 10.5 K/uL   RBC 4.63 3.87 - 5.11 MIL/uL   Hemoglobin 14.5 12.0 - 15.0 g/dL   HCT 95.6 21.3 - 08.6 %   MCV 92.7 78.0 - 100.0 fL   MCH 31.3 26.0 - 34.0 pg   MCHC 33.8 30.0 - 36.0 g/dL   RDW 57.8 46.9 - 62.9 %   Platelets 350 150 - 400 K/uL  Urinalysis, Routine w reflex microscopic  Result Value Ref Range   Color,  Urine YELLOW YELLOW   APPearance CLEAR CLEAR   Specific Gravity, Urine 1.011 1.005 - 1.030   pH 5.0 5.0 - 8.0   Glucose, UA NEGATIVE NEGATIVE mg/dL   Hgb urine dipstick NEGATIVE NEGATIVE   Bilirubin Urine NEGATIVE NEGATIVE   Ketones, ur NEGATIVE NEGATIVE mg/dL   Protein, ur NEGATIVE NEGATIVE mg/dL   Nitrite NEGATIVE NEGATIVE   Leukocytes, UA NEGATIVE NEGATIVE  I-Stat beta hCG blood, ED  Result Value Ref Range   I-stat hCG, quantitative <5.0 <5 mIU/mL   Comment 3          I-stat troponin, ED  Result Value Ref Range   Troponin i, poc 0.01 0.00 - 0.08 ng/mL   Comment 3             EKG EKG Interpretation  Date/Time:  Wednesday October 04 2017 13:18:36 EDT Ventricular Rate:  75 PR Interval:    QRS Duration: 105 QT Interval:  421 QTC Calculation: 471 R Axis:   100 Text Interpretation:  Sinus rhythm Right axis deviation Low voltage, precordial leads Abnormal R-wave progression, early transition No STEMI.  Confirmed by Alona Bene [phone]) on 10/04/2017 1:21:40 PM   Radiology US Abdomen Limited Ruq  Result Date: 10/04/2017 CLINICAL DATA:  Abdominal pain beginning at 4 a.m. this morning. EXAM: ULTRASOUND ABDOMEN LIMITED RIGHT UPPER QUADRANT COMPARISON:  Right upper quadrant ultrasound 07/15/2017. CT abdomen and pelvis 07/19/2016. FINDINGS: Gallbladder: Stones measuring up to 1.5 cm in diameter are identified. No wall thickening or pericholecystic fluid. Sonographer reports negative Murphy's sign. Common bile duct: Diameter: 0.5 cm. Liver: The liver demonstrates somewhat increased echogenicity consistent with fatty  infiltration. Ill-defined area of decreased echogenicity over the gallbladder fossa is likely due to fatty sparing and is seen on the prior ultrasound. Portal vein is patent on color Doppler imaging with normal direction of blood flow towards the liver. IMPRESSION: Gallstones without acute cholecystitis. Fatty infiltration of the liver. Electronically Signed   By: Drusilla Kanner M.D.   On: 10/04/2017 14:20    Procedures Procedures (including critical care time)  Medications Ordered in ED Medications  sodium chloride 0.9 % bolus 1,000 mL (1,000 mLs Intravenous New Bag/Given 10/04/17 1452)  gi cocktail (Maalox,Lidocaine,Donnatal) (30 mLs Oral Given 10/04/17 1449)     Initial Impression / Assessment and Plan / ED Course  I have reviewed the triage vital signs and the nursing notes.  Pertinent labs & imaging results that were available during my care of the patient were reviewed by me and considered in my medical decision making (see chart for details).   Patient presents to the emergency department today with complaints of abdominal pain.  Patient nontoxic-appearing, no apparent distress, vitals notable for elevated BP, doubt hypertensive emergency, she has had an elevated blood pressures at her last few ER visits, recommend PCP recheck with possible initiation of antihypertensive medicines.  On exam patient has some epigastric and right upper quadrant tenderness to palpation.  No peritoneal signs.  Will evaluate with abdominal labs, troponin, EKG, and RUQ ultrasound.  Patient would prefer to avoid strong narcotic type pain medication.  Patient's work-up has been reviewed in the emergency department and has been fairly unremarkable.  No leukocytosis.  No anemia.  No significant electrolyte abnormalities.  Lipase, LFTs, and renal function are WNL.  Pregnancy test negative.  Urinalysis without evidence of infection. Right upper quadrant ultrasound does show cholelithiasis without cholcystitis as well  as fatty infiltrate of the liver.   Patient did have some symptomatic improvement  following GI cocktail-repeat abdominal exam remains without peritoneal signs, doubt pancreatitis, bowel infection/perforation, diverticulitis, or appendicitis. Query cholelithiasis versus GERD vs. PUD as etiology, her pain is well controlled and she feels comfortable going home.  Will plan to start patient on PPI as well as Carafate.  Her EKG did not show obvious ischemia, her initial troponin was negative, patient signed out to Gabrielle Mortis PA-C at change of shift pending delta Troponin, anticipaState Street Corporationte this will be negative with subsequent discharge home with carafate and omeprazole- GI/general surgery follow up as patient's finances allow, [facility] as they will see patient without insurance.  I discussed results, treatment plan, need for follow-up, and return precautions with the patient. Provided opportunity for questions, patient confirmed understanding and is in agreement with plan.    Final Clinical Impressions(s) / ED Diagnoses   Final diagnoses:  Abdominal pain    ED Discharge Orders        Ordered    sucralfate (CARAFATE) 1 GM/10ML suspension  3 times daily with meals & bedtime     10/04/17 1602    omeprazole (PRILOSEC) 20 MG capsule  Daily     10/04/17 1602       Cherly Andersonetrucelli, Ryan Ogborn R, PA-C 10/04/17 1617    Arby BarrettePfeiffer, Marcy, MD 10/24/17 [phone]

## 2017-10-18 ENCOUNTER — Other Ambulatory Visit: Payer: Self-pay

## 2017-10-18 ENCOUNTER — Emergency Department (HOSPITAL_COMMUNITY): Payer: Self-pay

## 2017-10-18 ENCOUNTER — Emergency Department (HOSPITAL_COMMUNITY)
Admission: EM | Admit: 2017-10-18 | Discharge: 2017-10-18 | Disposition: A | Discharge status: Home / Self Care | Payer: Self-pay | Attending: Emergency Medicine | Admitting: Emergency Medicine

## 2017-10-18 ENCOUNTER — Encounter (HOSPITAL_COMMUNITY): Payer: Self-pay | Admitting: Emergency Medicine

## 2017-10-18 DIAGNOSIS — R0602 Shortness of breath: Secondary | ICD-10-CM | POA: Insufficient documentation

## 2017-10-18 DIAGNOSIS — M546 Pain in thoracic spine: Secondary | ICD-10-CM | POA: Insufficient documentation

## 2017-10-18 DIAGNOSIS — Z87891 Personal history of nicotine dependence: Secondary | ICD-10-CM | POA: Insufficient documentation

## 2017-10-18 DIAGNOSIS — Z79899 Other long term (current) drug therapy: Secondary | ICD-10-CM | POA: Insufficient documentation

## 2017-10-18 LAB — CBC WITH DIFFERENTIAL/PLATELET
BASOS ABS: 0 10*3/uL (ref 0.0–0.1)
Basophils Relative: 0 %
EOS PCT: 3 %
Eosinophils Absolute: 0.3 10*3/uL (ref 0.0–0.7)
HEMATOCRIT: 43.5 % (ref 36.0–46.0)
Hemoglobin: 14.7 g/dL (ref 12.0–15.0)
LYMPHS ABS: 2.8 10*3/uL (ref 0.7–4.0)
LYMPHS PCT: 31 %
MCH: 31.6 pg (ref 26.0–34.0)
MCHC: 33.8 g/dL (ref 30.0–36.0)
MCV: 93.5 fL (ref 78.0–100.0)
Monocytes Absolute: 0.6 10*3/uL (ref 0.1–1.0)
Monocytes Relative: 6 %
NEUTROS ABS: 5.4 10*3/uL (ref 1.7–7.7)
Neutrophils Relative %: 60 %
PLATELETS: 291 10*3/uL (ref 150–400)
RBC: 4.65 MIL/uL (ref 3.87–5.11)
RDW: 13.5 % (ref 11.5–15.5)
WBC: 9 10*3/uL (ref 4.0–10.5)

## 2017-10-18 LAB — BASIC METABOLIC PANEL
Anion gap: 10 (ref 5–15)
BUN: 19 mg/dL (ref 6–20)
CALCIUM: 9.7 mg/dL (ref 8.9–10.3)
CHLORIDE: 105 mmol/L (ref 98–111)
CO2: 28 mmol/L (ref 22–32)
CREATININE: 0.88 mg/dL (ref 0.44–1.00)
GFR calc Af Amer: >60 mL/min (ref >60)
GFR calc non Af Amer: >60 mL/min (ref >60)
GLUCOSE: 111 mg/dL — AB (ref 70–99)
Potassium: 3.7 mmol/L (ref 3.5–5.1)
Sodium: 143 mmol/L (ref 135–145)

## 2017-10-18 LAB — I-STAT TROPONIN, ED
Troponin i, poc: 0 ng/mL (ref 0.00–0.08)
Troponin i, poc: 0 ng/mL (ref 0.00–0.08)

## 2017-10-18 MED ORDER — IBUPROFEN 200 MG PO TABS
600.0000 mg | ORAL_TABLET | Freq: Once | ORAL | Status: AC
  Administered 2017-10-18: 600 mg via ORAL
  Filled 2017-10-18: qty 3

## 2017-10-18 MED ORDER — IOPAMIDOL (ISOVUE-370) INJECTION 76%
100.0000 mL | Freq: Once | INTRAVENOUS | Status: AC | PRN
  Administered 2017-10-18: 100 mL via INTRAVENOUS

## 2017-10-18 MED ORDER — HYDROMORPHONE HCL 1 MG/ML IJ SOLN
1.0000 mg | Freq: Once | INTRAMUSCULAR | Status: AC
  Administered 2017-10-18: 1 mg via INTRAVENOUS
  Filled 2017-10-18: qty 1

## 2017-10-18 MED ORDER — ASPIRIN 81 MG PO CHEW
324.0000 mg | CHEWABLE_TABLET | Freq: Once | ORAL | Status: AC
  Administered 2017-10-18: 324 mg via ORAL
  Filled 2017-10-18: qty 4

## 2017-10-18 MED ORDER — IOPAMIDOL (ISOVUE-370) INJECTION 76%
INTRAVENOUS | Status: AC
  Filled 2017-10-18: qty 100

## 2017-10-18 MED ORDER — NITROGLYCERIN 0.4 MG SL SUBL
0.4000 mg | SUBLINGUAL_TABLET | SUBLINGUAL | Status: DC | PRN
  Administered 2017-10-18: 0.4 mg via SUBLINGUAL
  Filled 2017-10-18: qty 1

## 2017-10-18 NOTE — ED Triage Notes (Signed)
Patient c/o mid back pain starting this am while fixing her hair. Pt reports doing kettle bell class yesterday and suspects it may be muscle related.

## 2017-10-18 NOTE — Discharge Instructions (Addendum)
If you develop worsening or recurrent pain, chest pain, shortness of breath, vomiting, weakness or numbness in your legs, or incontinence or any other new/concerning symptoms then return to the ER for evaluation.

## 2017-10-18 NOTE — ED Notes (Signed)
Patient transported to CT 

## 2017-10-18 NOTE — ED Provider Notes (Signed)
[facility]   CSN: [id number] Arrival date & time: 10/18/17  [id number]     History   Chief Complaint Chief Complaint  Patient presents with  . Back Pain    HPI Metro KungViviana Wohlford is a 57 y.o. female.  HPI  57 year old female presents with sudden back pain starting about an hour ago.  She states that she was already awake when it started.  She was in the active brushing and doing her hair when it started so she thought it might be a pulled muscle.  She did do kettle bells yesterday but does not remember an injury.  When the pain first started it seemed to take her breath away and since then has had some shortness of breath.  There is no chest pain.  The back pain seems to wax and wane but at worst is about a 7 or 8 out of 10.  It is not reproducible to her.  No movement makes it better or worse or position.  No vomiting.  She has chronic arm pain bilaterally from arthritis but no new arm pain or numbness.  No leg weakness or numbness.  She is had some pedal edema for about a month but no new swelling recently.  Has not taken anything for the pain.  She has gallstones but states she is never had back pain with them and is not currently having any abdominal pain.  This feels different.  Past Medical History:  Diagnosis Date  . Anemia   . Gallstones     There are no active problems to display for this patient.   Past Surgical History:  Procedure Laterality Date  . ABDOMINAL HYSTERECTOMY    . CARDIAC ELECTROPHYSIOLOGY STUDY AND ABLATION    . CESAREAN SECTION     x 2     OB History   None      Home Medications    Prior to Admission medications   Medication Sig Start Date End Date Taking? Authorizing Provider  b complex vitamins tablet Take 1 tablet by mouth daily.   Yes [provider]  bismuth subsalicylate (PEPTO BISMOL) 262 MG/15ML suspension Take 30 mLs by mouth every 6 (six) hours as needed.   Yes [provider]  calcium carbonate (TUMS - DOSED IN MG ELEMENTAL CALCIUM) 500 MG chewable tablet Chew 1 tablet by mouth 2 (two) times daily as needed for indigestion or heartburn.   Yes [provider]  cholecalciferol (VITAMIN D) 1000 units tablet Take 2,000 Units by mouth daily.   Yes [provider]  Flaxseed, Linseed, (FLAXSEED OIL) 1000 MG CAPS Take 1,000 mg by mouth daily.   Yes [provider]  omega-3 acid ethyl esters (LOVAZA) 1 g capsule Take 1 g by mouth daily.   Yes [provider]  omeprazole (PRILOSEC) 20 MG capsule Take 1 capsule (20 mg total) by mouth daily. 10/04/17  Yes Petrucelli, Samantha R, PA-C  ondansetron (ZOFRAN) 4 MG tablet Take 4 mg by mouth every 8 (eight) hours as needed for nausea or vomiting.   Yes [provider]  Prenatal Vit-Fe Fumarate-FA (PRENATAL MULTIVITAMIN) TABS tablet Take 1 tablet by mouth daily at 12 noon.   Yes [provider]  sucralfate (CARAFATE) 1 GM/10ML suspension Take 10 mLs (1 g total) by mouth 4 (four) times daily -  with meals and at bedtime. 10/04/17  Yes Petrucelli, Pleas KochSamantha R, PA-C    Family History Family History  Problem Relation Age  of Onset  . Breast cancer Mother   . Ovarian cancer Sister   . Brain cancer Maternal Aunt   . Colon cancer Neg Hx   . Esophageal cancer Neg Hx   . Stomach cancer Neg Hx   . Pancreatic cancer Neg Hx   . Liver disease Neg Hx     Social History Social History   Tobacco Use  . Smoking status: Former Games developermoker  . Smokeless tobacco: Never Used  Substance Use Topics  . Alcohol use: Yes    Comment: 4-5 drinks weekly  . Drug use: No     Allergies   Amoxicillin   Review of Systems Review of Systems  Constitutional: Negative for fever.  Respiratory: Positive for shortness of breath.   Cardiovascular: Negative for chest pain.  Gastrointestinal: Negative for abdominal pain and vomiting.  Genitourinary: Negative for dysuria and hematuria.    Musculoskeletal: Positive for back pain.  Neurological: Negative for weakness and numbness.  All other systems reviewed and are negative.    Physical Exam Updated Vital Signs BP 138/76   Pulse 61   Temp 97.7 F (36.5 C) (Oral)   Resp 19   Ht 5' 4.5" (1.638 m)   Wt 99.8 kg   SpO2 95%   BMI 37.18 kg/m   Physical Exam  Constitutional: She is oriented to person, place, and time. She appears well-developed and well-nourished. No distress.  Appears uncomfortable but no distress  HENT:  Head: Normocephalic and atraumatic.  Right Ear: External ear normal.  Left Ear: External ear normal.  Nose: Nose normal.  Eyes: Right eye exhibits no discharge. Left eye exhibits no discharge.  Cardiovascular: Normal rate, regular rhythm and normal heart sounds.  Pulses:      Radial pulses are 2+ on the right side, and 2+ on the left side.  Pulmonary/Chest: Effort normal and breath sounds normal.  Abdominal: Soft. She exhibits no distension. There is no tenderness.  Musculoskeletal:       Cervical back: She exhibits no tenderness.       Thoracic back: She exhibits no tenderness.       Lumbar back: She exhibits no tenderness.  Neurological: She is alert and oriented to person, place, and time.  Skin: Skin is warm and dry. She is not diaphoretic.  Nursing note and vitals reviewed.    ED Treatments / Results  Labs (all labs ordered are listed, but only abnormal results are displayed) Labs Reviewed  BASIC METABOLIC PANEL - Abnormal; Notable for the following components:      Result Value   Glucose, Bld 111 (*)    All other components within normal limits  CBC WITH DIFFERENTIAL/PLATELET  I-STAT TROPONIN, ED  I-STAT TROPONIN, ED    EKG EKG Interpretation  Date/Time:  Wednesday October 18 2017 08:43:50 EDT Ventricular Rate:  87 PR Interval:    QRS Duration: 107 QT Interval:  394 QTC Calculation: 474 R Axis:   102 Text Interpretation:  Sinus rhythm Low voltage, precordial leads  Consider right ventricular hypertrophy no significant change since October 04 2017 Confirmed by Pricilla LovelessGoldston, Dulce Martian [phone]([id number]) on 10/18/2017 8:46:43 AM Also confirmed by Pricilla LovelessGoldston, Vikas Wegmann [phone]), editor Barbette Hairassel, Kerry [phone]([id number])  on 10/18/2017 10:44:49 AM   Radiology Dg Chest 2 View  Result Date: 10/18/2017 CLINICAL DATA:  Chest and back pain EXAM: CHEST - 2 VIEW COMPARISON:  Jul 15, 2017 FINDINGS: There is no edema or consolidation. Heart size and pulmonary vascularity are normal. No adenopathy. No pneumothorax. No bone lesions. IMPRESSION:  No edema or consolidation. Electronically Signed   By: Bretta Bang III M.D.   On: 10/18/2017 09:42   Ct Angio Chest/abd/pel For Dissection W And/or Wo Contrast  Result Date: 10/18/2017 CLINICAL DATA:  Acute chest and back pain. EXAM: CT ANGIOGRAPHY CHEST, ABDOMEN AND PELVIS TECHNIQUE: Multidetector CT imaging through the chest, abdomen and pelvis was performed using the standard protocol during bolus administration of intravenous contrast. Multiplanar reconstructed images and MIPs were obtained and reviewed to evaluate the vascular anatomy. CONTRAST:  ISOVUE-370 IOPAMIDOL (ISOVUE-370) INJECTION 76% COMPARISON:  Chest x-ray dated 10/18/2017 and CT scan abdomen dated 07/19/2017 FINDINGS: CTA CHEST FINDINGS Cardiovascular: There is no evidence of aortic dissection, pulmonary emboli, or aneurysm. Heart size is normal. No pericardial effusion. Mediastinum/Nodes: Tiny hiatal hernia. Esophagus is otherwise normal. Visualized portion of the thyroid gland is normal. Trachea is normal. Lungs/Pleura: Lungs are clear. No pleural effusion or pneumothorax. Musculoskeletal: No chest wall abnormality. No acute or significant osseous findings. Review of the MIP images confirms the above findings. CTA ABDOMEN AND PELVIS FINDINGS VASCULAR Aorta: Normal. No aneurysm, atherosclerosis, or dissection. Branch vessels appear normal. Celiac: Normal. SMA: Normal. Renals: Normal single renal arteries  bilaterally. IMA: Normal. Inflow: Normal. Veins: Normal. Review of the MIP images confirms the above findings. NON-VASCULAR Hepatobiliary: Slight hepatic steatosis. No focal liver lesions. Multiple gallstones. No gallbladder wall thickening or gallbladder distention. No bile duct dilatation. Pancreas: Unremarkable. No pancreatic ductal dilatation or surrounding inflammatory changes. Spleen: Normal in size without focal abnormality. Adrenals/Urinary Tract: Adrenal glands are unremarkable. Kidneys are normal, without renal calculi, focal lesion, or hydronephrosis. Bladder is unremarkable. Stomach/Bowel: Stomach is within normal limits. Appendix appears normal. No evidence of bowel wall thickening, distention, or inflammatory changes. Lymphatic: No adenopathy. Reproductive: Status post hysterectomy. No adnexal masses. Other: No abdominal wall hernia or abnormality. No abdominopelvic ascites. Musculoskeletal: No acute bone abnormality. 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 Slight disc space narrowing at L3-4 and L4-5. Review of the MIP images confirms the above findings. IMPRESSION: 1. No acute abnormalities of chest, abdomen or pelvis. Specifically, no evidence of aortic dissection. 2. Tiny hiatal hernia. 3. Cholelithiasis. 4. Hepatic steatosis. 5. 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 Electronically Signed   By: Francene Boyers M.D.   On: 10/18/2017 10:33    Procedures Procedures (including critical care time)  Medications Ordered in ED Medications  iopamidol (ISOVUE-370) 76 % injection (has no administration in time range)  aspirin chewable tablet 324 mg (324 mg Oral Given 10/18/17 0859)  HYDROmorphone (DILAUDID) injection 1 mg (1 mg Intravenous Given 10/18/17 0956)  iopamidol (ISOVUE-370) 76 % injection 100 mL (100 mLs Intravenous Contrast Given 10/18/17 1007)  ibuprofen (ADVIL,MOTRIN) tablet 600 mg (600 mg Oral Given 10/18/17 1107)      Initial Impression / Assessment and Plan / ED Course  I have reviewed the triage vital signs and the nursing notes.  Pertinent labs & imaging results that were available during my care of the patient were reviewed by me and considered in my medical decision making (see chart for details).     Patient's back pain is of unclear etiology.  Given the degree of back pain in the mid-thoracic back, no clear cause, and hypertension noted, a work-up for dissection was obtained with a CT scan.  This is negative.  I do not think this is related to her cholelithiasis as there is no abdominal pain.  There is also no chest pain  and she has 2 ECGs that are benign and 2 troponins that are negative.  Her HEART score is a 2.  I discussed results with patient and we discussed following up with a PCP as she does not appear to need an emergent admission.  I highly doubt this is unstable angina.  I think she stable for discharge home and advised treating with ibuprofen and Tylenol.  Return precautions.  Final Clinical Impressions(s) / ED Diagnoses   Final diagnoses:  Acute midline thoracic back pain    ED Discharge Orders    None       Pricilla Loveless, MD 10/18/17 1504

## 2018-05-10 ENCOUNTER — Emergency Department (HOSPITAL_COMMUNITY): Payer: Self-pay

## 2018-05-10 ENCOUNTER — Emergency Department (HOSPITAL_COMMUNITY)
Admission: EM | Admit: 2018-05-10 | Discharge: 2018-05-10 | Disposition: A | Discharge status: Home / Self Care | Payer: Self-pay | Attending: Emergency Medicine | Admitting: Emergency Medicine

## 2018-05-10 ENCOUNTER — Other Ambulatory Visit: Payer: Self-pay

## 2018-05-10 ENCOUNTER — Encounter (HOSPITAL_COMMUNITY): Payer: Self-pay | Admitting: Emergency Medicine

## 2018-05-10 DIAGNOSIS — R519 Headache, unspecified: Secondary | ICD-10-CM

## 2018-05-10 DIAGNOSIS — R51 Headache: Secondary | ICD-10-CM | POA: Insufficient documentation

## 2018-05-10 DIAGNOSIS — Z87891 Personal history of nicotine dependence: Secondary | ICD-10-CM | POA: Insufficient documentation

## 2018-05-10 DIAGNOSIS — R1013 Epigastric pain: Secondary | ICD-10-CM

## 2018-05-10 LAB — I-STAT TROPONIN, ED
TROPONIN I, POC: 0 ng/mL (ref 0.00–0.08)
Troponin i, poc: 0.01 ng/mL (ref 0.00–0.08)

## 2018-05-10 LAB — COMPREHENSIVE METABOLIC PANEL
ALBUMIN: 4.2 g/dL (ref 3.5–5.0)
ALK PHOS: 83 U/L (ref 38–126)
ALT: 21 U/L (ref 0–44)
AST: 28 U/L (ref 15–41)
Anion gap: 8 (ref 5–15)
BILIRUBIN TOTAL: 0.4 mg/dL (ref 0.3–1.2)
BUN: 15 mg/dL (ref 6–20)
CALCIUM: 9.1 mg/dL (ref 8.9–10.3)
CO2: 25 mmol/L (ref 22–32)
CREATININE: 0.77 mg/dL (ref 0.44–1.00)
Chloride: 106 mmol/L (ref 98–111)
GFR calc Af Amer: >60 mL/min (ref >60)
GLUCOSE: 114 mg/dL — AB (ref 70–99)
Potassium: 3.4 mmol/L — ABNORMAL LOW (ref 3.5–5.1)
Sodium: 139 mmol/L (ref 135–145)
TOTAL PROTEIN: 7.7 g/dL (ref 6.5–8.1)

## 2018-05-10 LAB — CBC
HEMATOCRIT: 44.5 % (ref 36.0–46.0)
HEMOGLOBIN: 14.3 g/dL (ref 12.0–15.0)
MCH: 31 pg (ref 26.0–34.0)
MCHC: 32.1 g/dL (ref 30.0–36.0)
MCV: 96.3 fL (ref 80.0–100.0)
NRBC: 0 % (ref 0.0–0.2)
Platelets: 294 10*3/uL (ref 150–400)
RBC: 4.62 MIL/uL (ref 3.87–5.11)
RDW: 13 % (ref 11.5–15.5)
WBC: 10.4 10*3/uL (ref 4.0–10.5)

## 2018-05-10 LAB — LIPASE, BLOOD: Lipase: 41 U/L (ref 11–51)

## 2018-05-10 MED ORDER — ONDANSETRON 4 MG PO TBDP: 4.0000 mg | ORAL_TABLET | Freq: Three times a day (TID) | ORAL | 0 refills | Status: DC | PRN

## 2018-05-10 NOTE — ED Triage Notes (Signed)
Patient states her skin starting feeling like it was burning, getting a discomfort feeling in the back of her head, and she is having the shakes all of a sudden. Patient states this started about 1 hour ago.

## 2018-05-10 NOTE — ED Notes (Signed)
Patient transported to X-ray via stretcher 

## 2018-05-10 NOTE — Discharge Instructions (Addendum)
Thank you for allowing me to care for you today in the Emergency Department.   Your work-up was reassuring today.  You can take 600 mg of ibuprofen with food or 650 mg of Tylenol once every 6 hours if you develop a headache.  Call the clinic listed above to get established with a primary care provider to follow-up today from her ER visit.  One of the things we do really well in the ER is to rule out life-threatening conditions.  Even if your work-up is reassuring, following up with primary care can be beneficial to ensure that you do not have a repeat of your symptoms.  Let 1 tablet of Zofran dissolve in your tongue every 8 hours as needed for nausea or vomiting.  Return to the emergency department if you develop a persistent headache that does not resolve or is accompanied by numbness, weakness, severe shortness of breath, chest pain, slurred speech, persistent changes in your vision, or other new, concerning symptoms.

## 2018-05-10 NOTE — ED Provider Notes (Signed)
[facility]-EMERGENCY DEPT Provider Note   CSN: [id number] Arrival date & time: 05/10/18  0144    History   Chief Complaint Chief Complaint  Patient presents with  . Headache    HPI Gail Turner is a 58 y.o. female with a history of biliary colic and anemia who presents to the emergency department with a chief complaint of "my skin felt like it was burning all over."   The patient reports that she had an episode of nausea, epigastric pain, shortness of breath, and a posterior headache.  Reports the episode began shortly prior to arrival and has since resolved.  She continues to report a mild posterior headache.  She reports that she has also been having nasal congestion sore throat over the last few days and thinks her headache may be related to a "sinus infection because she I was previously told that the sinuses and the back of my head can make me feel like this." She denies numbness, weakness, visual changes, fever, chills, neck pain, arm pain, vomiting, diarrhea, constipation, back pain, or urinary symptoms.  She has a history of biliary colic, but is not established with general surgery as she does not currently have medical insurance.  She denies that her symptoms are associated with eating.  No history of similar.  No treatment prior to arrival.  No family history of cardiovascular disease.     The history is provided by the patient. No language interpreter was used.    Past Medical History:  Diagnosis Date  . Anemia   . Gallstones     There are no active problems to display for this patient.   Past Surgical History:  Procedure Laterality Date  . ABDOMINAL HYSTERECTOMY    . CARDIAC ELECTROPHYSIOLOGY STUDY AND ABLATION    . CESAREAN SECTION     x 2     OB History   No obstetric history on file.      Home Medications    Prior to Admission medications   Medication Sig Start Date End Date Taking? Authorizing Provider  ondansetron (ZOFRAN  ODT) 4 MG disintegrating tablet Take 1 tablet (4 mg total) by mouth every 8 (eight) hours as needed. 05/10/18   Maeve Debord A, PA-C    Family History Family History  Problem Relation Age of Onset  . Breast cancer Mother   . Ovarian cancer Sister   . Brain cancer Maternal Aunt   . Colon cancer Neg Hx   . Esophageal cancer Neg Hx   . Stomach cancer Neg Hx   . Pancreatic cancer Neg Hx   . Liver disease Neg Hx     Social History Social History   Tobacco Use  . Smoking status: Former Games developer  . Smokeless tobacco: Never Used  Substance Use Topics  . Alcohol use: Yes    Comment: 4-5 drinks weekly  . Drug use: No     Allergies   Amoxicillin   Review of Systems Review of Systems  Constitutional: Positive for chills. Negative for activity change and fever.  HENT: Positive for congestion and sore throat.   Eyes: Negative for photophobia and visual disturbance.  Respiratory: Positive for shortness of breath.   Cardiovascular: Positive for chest pain.  Gastrointestinal: Positive for abdominal pain and nausea. Negative for anal bleeding, blood in stool, constipation and diarrhea.  Genitourinary: Negative for dysuria.  Musculoskeletal: Negative for back pain.  Skin: Negative for rash.  Allergic/Immunologic: Negative for immunocompromised state.  Neurological: Positive for headaches.  Negative for dizziness, weakness, light-headedness and numbness.  Psychiatric/Behavioral: Negative for confusion.     Physical Exam Updated Vital Signs BP (!) 145/92   Pulse 64   Temp 97.9 F (36.6 C) (Oral)   Resp (!) 21   Ht  (1.626 m)   Wt 95.3 kg   SpO2 96%   BMI 36.05 kg/m   Physical Exam Vitals signs and nursing note reviewed.  Constitutional:      General: She is not in acute distress. HENT:     Head: Normocephalic.     Right Ear: Tympanic membrane and ear canal normal.     Left Ear: Tympanic membrane and ear canal normal.     Nose: No congestion or rhinorrhea.     Right  Sinus: No maxillary sinus tenderness or frontal sinus tenderness.     Left Sinus: No maxillary sinus tenderness or frontal sinus tenderness.     Mouth/Throat:     Mouth: Mucous membranes are moist.  Eyes:     Extraocular Movements: Extraocular movements intact.     Conjunctiva/sclera: Conjunctivae normal.     Pupils: Pupils are equal, round, and reactive to light.  Neck:     Musculoskeletal: Normal range of motion and neck supple. No neck rigidity or muscular tenderness.     Vascular: No carotid bruit.  Cardiovascular:     Rate and Rhythm: Normal rate and regular rhythm.     Heart sounds: No murmur. No friction rub. No gallop.   Pulmonary:     Effort: Pulmonary effort is normal. No respiratory distress.     Breath sounds: No stridor. No wheezing, rhonchi or rales.  Chest:     Chest wall: No tenderness.  Abdominal:     General: There is no distension.     Palpations: Abdomen is soft. There is no mass.     Tenderness: There is abdominal tenderness. There is no right CVA tenderness, left CVA tenderness, guarding or rebound.     Hernia: No hernia is present.     Comments: Tender to palpation in the epigastric region.  No rebound or guarding.  Abdomen is soft, nondistended.  Negative Murphy sign.  No CVA tenderness bilaterally.  No tenderness over McBurney's point.  Lymphadenopathy:     Cervical: No cervical adenopathy.  Skin:    General: Skin is warm.     Capillary Refill: Capillary refill takes less than 2 seconds.     Findings: No rash.  Neurological:     General: No focal deficit present.     Mental Status: She is alert and oriented to person, place, and time.     Comments: Cranial nerves II through XII are grossly intact.  Ambulatory without difficulty.  Answers questions appropriately.  Moves all 4 extremities.  5 of 5 strength against resistance of the bilateral upper and lower extremities.  Sensation is intact and equal throughout.  Finger-nose is intact bilaterally.    Psychiatric:        Behavior: Behavior normal.      ED Treatments / Results  Labs (all labs ordered are listed, but only abnormal results are displayed) Labs Reviewed  COMPREHENSIVE METABOLIC PANEL - Abnormal; Notable for the following components:      Result Value   Potassium 3.4 (*)    Glucose, Bld 114 (*)    All other components within normal limits  CBC  LIPASE, BLOOD  I-STAT TROPONIN, ED  I-STAT TROPONIN, ED    EKG None  Radiology Dg Chest  2 View  Result Date: 05/10/2018 CLINICAL DATA:  Chest pain EXAM: CHEST - 2 VIEW COMPARISON:  10/18/2017 FINDINGS: No acute consolidation or pleural effusion. Stable cardiomediastinal silhouette. No pneumothorax. IMPRESSION: No active cardiopulmonary disease. Electronically Signed   By: Jasmine Pang M.D.   On: 05/10/2018 03:43    Procedures Procedures (including critical care time)  Medications Ordered in ED Medications - No data to display   Initial Impression / Assessment and Plan / ED Course  I have reviewed the triage vital signs and the nursing notes.  Pertinent labs & imaging results that were available during my care of the patient were reviewed by me and considered in my medical decision making (see chart for details).        58 year old female with a history of biliary colic and anemia presenting with epigastric pain, headache, increased burping and belching, nasal congestion, sore throat, and "burning pain all over".  Symptoms have since improved since arrival in the ER.  She was hypertensive on arrival with no history of hypertension.  Symptoms resolved without treatment.  Will check basic labs and reassess.  Labs are reassuring.  Mild hypokalemia of 3.4.  Chest x-ray is unremarkable.  Delta troponin is negative.  EKG with sinus rhythm.  On re-evaluation, her headache is resolved.  She is asymptomatic.  She was ambulated in the ER and remained asymptomatic.  Low suspicion for vertebral dissection, ACS, bleeding  peptic ulcer, pneumothorax, aortic dissection, or PE.  At this time, I am uncertain of the etiology of the patient's symptoms.  The patient has not established with primary care so I will refer her to primary care clinic since all symptoms have resolved and work-up is reassuring.  Strict return precautions given.  She is hemodynamically stable no acute distress.  Safe for discharge home with outpatient follow-up at this time.  Final Clinical Impressions(s) / ED Diagnoses   Final diagnoses:  Epigastric pain  Acute nonintractable headache, unspecified headache type    ED Discharge Orders         Ordered    ondansetron (ZOFRAN ODT) 4 MG disintegrating tablet  Every 8 hours PRN     05/10/18 0621           Frederik Pear A, PA-C 05/10/18 7782    Gilda Crease, MD 05/10/18 [id number]

## 2018-05-19 ENCOUNTER — Other Ambulatory Visit: Payer: Self-pay

## 2018-05-19 DIAGNOSIS — Z87891 Personal history of nicotine dependence: Secondary | ICD-10-CM | POA: Insufficient documentation

## 2018-05-19 DIAGNOSIS — Z6835 Body mass index (BMI) 35.0-35.9, adult: Secondary | ICD-10-CM | POA: Insufficient documentation

## 2018-05-19 DIAGNOSIS — K8010 Calculus of gallbladder with chronic cholecystitis without obstruction: Principal | ICD-10-CM | POA: Insufficient documentation

## 2018-05-19 DIAGNOSIS — E669 Obesity, unspecified: Secondary | ICD-10-CM | POA: Insufficient documentation

## 2018-05-20 ENCOUNTER — Encounter (HOSPITAL_COMMUNITY): Payer: Self-pay | Admitting: Surgery

## 2018-05-20 ENCOUNTER — Emergency Department (HOSPITAL_COMMUNITY): Payer: Self-pay | Admitting: Anesthesiology

## 2018-05-20 ENCOUNTER — Observation Stay (HOSPITAL_COMMUNITY): Payer: Self-pay

## 2018-05-20 ENCOUNTER — Other Ambulatory Visit: Payer: Self-pay

## 2018-05-20 ENCOUNTER — Encounter (HOSPITAL_COMMUNITY)
Admission: EM | Disposition: A | Discharge status: Home / Self Care | Payer: Self-pay | Source: Home / Self Care | Attending: Emergency Medicine

## 2018-05-20 ENCOUNTER — Observation Stay (HOSPITAL_COMMUNITY)
Admission: EM | Admit: 2018-05-20 | Discharge: 2018-05-21 | Disposition: A | Discharge status: Home / Self Care | Payer: Self-pay | Attending: Surgery | Admitting: Surgery

## 2018-05-20 ENCOUNTER — Emergency Department (HOSPITAL_COMMUNITY): Payer: Self-pay

## 2018-05-20 DIAGNOSIS — K8050 Calculus of bile duct without cholangitis or cholecystitis without obstruction: Secondary | ICD-10-CM | POA: Diagnosis present

## 2018-05-20 DIAGNOSIS — K8010 Calculus of gallbladder with chronic cholecystitis without obstruction: Secondary | ICD-10-CM | POA: Diagnosis present

## 2018-05-20 DIAGNOSIS — K8020 Calculus of gallbladder without cholecystitis without obstruction: Secondary | ICD-10-CM

## 2018-05-20 DIAGNOSIS — R1011 Right upper quadrant pain: Secondary | ICD-10-CM

## 2018-05-20 DIAGNOSIS — K810 Acute cholecystitis: Secondary | ICD-10-CM

## 2018-05-20 HISTORY — PX: CHOLECYSTECTOMY: SHX55

## 2018-05-20 LAB — COMPREHENSIVE METABOLIC PANEL
ALBUMIN: 4.1 g/dL (ref 3.5–5.0)
ALK PHOS: 84 U/L (ref 38–126)
ALT: 21 U/L (ref 0–44)
ANION GAP: 9 (ref 5–15)
AST: 23 U/L (ref 15–41)
BUN: 23 mg/dL — ABNORMAL HIGH (ref 6–20)
CALCIUM: 8.7 mg/dL — AB (ref 8.9–10.3)
CHLORIDE: 105 mmol/L (ref 98–111)
CO2: 23 mmol/L (ref 22–32)
Creatinine, Ser: 0.62 mg/dL (ref 0.44–1.00)
GFR calc non Af Amer: >60 mL/min (ref >60)
Glucose, Bld: 117 mg/dL — ABNORMAL HIGH (ref 70–99)
POTASSIUM: 3.9 mmol/L (ref 3.5–5.1)
SODIUM: 137 mmol/L (ref 135–145)
Total Bilirubin: 0.5 mg/dL (ref 0.3–1.2)
Total Protein: 7.5 g/dL (ref 6.5–8.1)

## 2018-05-20 LAB — CBC WITH DIFFERENTIAL/PLATELET
Abs Immature Granulocytes: 0.04 10*3/uL (ref 0.00–0.07)
BASOS ABS: 0.1 10*3/uL (ref 0.0–0.1)
BASOS PCT: 1 %
EOS ABS: 0.1 10*3/uL (ref 0.0–0.5)
Eosinophils Relative: 1 %
HCT: 42.2 % (ref 36.0–46.0)
Hemoglobin: 13.3 g/dL (ref 12.0–15.0)
Immature Granulocytes: 0 %
Lymphocytes Relative: 21 %
Lymphs Abs: 2.2 10*3/uL (ref 0.7–4.0)
MCH: 30.6 pg (ref 26.0–34.0)
MCHC: 31.5 g/dL (ref 30.0–36.0)
MCV: 97 fL (ref 80.0–100.0)
Monocytes Absolute: 0.5 10*3/uL (ref 0.1–1.0)
Monocytes Relative: 5 %
NRBC: 0 % (ref 0.0–0.2)
Neutro Abs: 7.8 10*3/uL — ABNORMAL HIGH (ref 1.7–7.7)
Neutrophils Relative %: 72 %
PLATELETS: 279 10*3/uL (ref 150–400)
RBC: 4.35 MIL/uL (ref 3.87–5.11)
RDW: 13.2 % (ref 11.5–15.5)
WBC: 10.6 10*3/uL — AB (ref 4.0–10.5)

## 2018-05-20 LAB — LIPASE, BLOOD: Lipase: 28 U/L (ref 11–51)

## 2018-05-20 SURGERY — LAPAROSCOPIC CHOLECYSTECTOMY WITH INTRAOPERATIVE CHOLANGIOGRAM
Anesthesia: General | Site: Abdomen

## 2018-05-20 MED ORDER — TRAMADOL HCL 50 MG PO TABS: 50.0000 mg | ORAL_TABLET | Freq: Four times a day (QID) | ORAL | Status: DC | PRN

## 2018-05-20 MED ORDER — KCL IN DEXTROSE-NACL 20-5-0.45 MEQ/L-%-% IV SOLN
INTRAVENOUS | Status: DC
  Administered 2018-05-20 – 2018-05-21 (×2): via INTRAVENOUS
  Filled 2018-05-20 (×2): qty 1000

## 2018-05-20 MED ORDER — FENTANYL CITRATE (PF) 100 MCG/2ML IJ SOLN: 100.0000 ug | INTRAMUSCULAR | Status: DC | PRN

## 2018-05-20 MED ORDER — DEXAMETHASONE SODIUM PHOSPHATE 10 MG/ML IJ SOLN
INTRAMUSCULAR | Status: DC | PRN
  Administered 2018-05-20: 10 mg via INTRAVENOUS

## 2018-05-20 MED ORDER — ONDANSETRON HCL 4 MG/2ML IJ SOLN
INTRAMUSCULAR | Status: AC
  Filled 2018-05-20: qty 2

## 2018-05-20 MED ORDER — FENTANYL CITRATE (PF) 100 MCG/2ML IJ SOLN
100.0000 ug | Freq: Once | INTRAMUSCULAR | Status: AC
  Administered 2018-05-20: 100 ug via INTRAVENOUS
  Filled 2018-05-20: qty 2

## 2018-05-20 MED ORDER — BUPIVACAINE-EPINEPHRINE 0.25% -1:200000 IJ SOLN
INTRAMUSCULAR | Status: DC | PRN
  Administered 2018-05-20: 20 mL

## 2018-05-20 MED ORDER — PROPOFOL 10 MG/ML IV BOLUS
INTRAVENOUS | Status: AC
  Filled 2018-05-20: qty 20

## 2018-05-20 MED ORDER — ONDANSETRON HCL 4 MG/2ML IJ SOLN
INTRAMUSCULAR | Status: DC | PRN
  Administered 2018-05-20: 4 mg via INTRAVENOUS

## 2018-05-20 MED ORDER — FENTANYL CITRATE (PF) 100 MCG/2ML IJ SOLN
25.0000 ug | INTRAMUSCULAR | Status: DC | PRN
  Administered 2018-05-20 (×3): 50 ug via INTRAVENOUS

## 2018-05-20 MED ORDER — LACTATED RINGERS IV SOLN
INTRAVENOUS | Status: AC | PRN
  Administered 2018-05-20: 1000 mL

## 2018-05-20 MED ORDER — PROPOFOL 10 MG/ML IV BOLUS
INTRAVENOUS | Status: DC | PRN
  Administered 2018-05-20: 170 mg via INTRAVENOUS

## 2018-05-20 MED ORDER — ONDANSETRON 4 MG PO TBDP: 4.0000 mg | ORAL_TABLET | Freq: Four times a day (QID) | ORAL | Status: DC | PRN

## 2018-05-20 MED ORDER — SUGAMMADEX SODIUM 200 MG/2ML IV SOLN
INTRAVENOUS | Status: AC
  Filled 2018-05-20: qty 2

## 2018-05-20 MED ORDER — LIDOCAINE 2% (20 MG/ML) 5 ML SYRINGE
INTRAMUSCULAR | Status: DC | PRN
  Administered 2018-05-20: 75 mg via INTRAVENOUS

## 2018-05-20 MED ORDER — 0.9 % SODIUM CHLORIDE (POUR BTL) OPTIME
TOPICAL | Status: DC | PRN
  Administered 2018-05-20: 1000 mL

## 2018-05-20 MED ORDER — ROCURONIUM BROMIDE 10 MG/ML (PF) SYRINGE
PREFILLED_SYRINGE | INTRAVENOUS | Status: AC
  Filled 2018-05-20: qty 10

## 2018-05-20 MED ORDER — OXYCODONE HCL 5 MG PO TABS: 5.0000 mg | ORAL_TABLET | Freq: Once | ORAL | Status: DC | PRN

## 2018-05-20 MED ORDER — MIDAZOLAM HCL 2 MG/2ML IJ SOLN
INTRAMUSCULAR | Status: AC
  Filled 2018-05-20: qty 2

## 2018-05-20 MED ORDER — LIDOCAINE 2% (20 MG/ML) 5 ML SYRINGE
INTRAMUSCULAR | Status: AC
  Filled 2018-05-20: qty 5

## 2018-05-20 MED ORDER — FENTANYL CITRATE (PF) 250 MCG/5ML IJ SOLN
INTRAMUSCULAR | Status: DC | PRN
  Administered 2018-05-20: 50 ug via INTRAVENOUS
  Administered 2018-05-20: 75 ug via INTRAVENOUS
  Administered 2018-05-20: 125 ug via INTRAVENOUS

## 2018-05-20 MED ORDER — MIDAZOLAM HCL 5 MG/5ML IJ SOLN
INTRAMUSCULAR | Status: DC | PRN
  Administered 2018-05-20: 2 mg via INTRAVENOUS

## 2018-05-20 MED ORDER — ACETAMINOPHEN 325 MG PO TABS
650.0000 mg | ORAL_TABLET | Freq: Four times a day (QID) | ORAL | Status: DC | PRN
  Administered 2018-05-20: 650 mg via ORAL
  Filled 2018-05-20: qty 2

## 2018-05-20 MED ORDER — DEXAMETHASONE SODIUM PHOSPHATE 10 MG/ML IJ SOLN
INTRAMUSCULAR | Status: AC
  Filled 2018-05-20: qty 1

## 2018-05-20 MED ORDER — ALUM & MAG HYDROXIDE-SIMETH 200-200-20 MG/5ML PO SUSP
30.0000 mL | Freq: Four times a day (QID) | ORAL | Status: DC | PRN
  Administered 2018-05-20: 30 mL via ORAL
  Filled 2018-05-20: qty 30

## 2018-05-20 MED ORDER — ONDANSETRON HCL 4 MG/2ML IJ SOLN
4.0000 mg | Freq: Once | INTRAMUSCULAR | Status: AC
  Administered 2018-05-20: 4 mg via INTRAVENOUS
  Filled 2018-05-20: qty 2

## 2018-05-20 MED ORDER — SCOPOLAMINE 1 MG/3DAYS TD PT72
MEDICATED_PATCH | TRANSDERMAL | Status: AC
  Administered 2018-05-20: 1.5 mg
  Filled 2018-05-20: qty 1

## 2018-05-20 MED ORDER — ONDANSETRON HCL 4 MG/2ML IJ SOLN: 4.0000 mg | Freq: Four times a day (QID) | INTRAMUSCULAR | Status: DC | PRN

## 2018-05-20 MED ORDER — HYDRALAZINE HCL 20 MG/ML IJ SOLN
INTRAMUSCULAR | Status: AC
  Filled 2018-05-20: qty 1

## 2018-05-20 MED ORDER — FENTANYL CITRATE (PF) 100 MCG/2ML IJ SOLN
INTRAMUSCULAR | Status: AC
  Filled 2018-05-20: qty 4

## 2018-05-20 MED ORDER — LACTATED RINGERS IV SOLN
INTRAVENOUS | Status: DC | PRN
  Administered 2018-05-20: 09:00:00 via INTRAVENOUS

## 2018-05-20 MED ORDER — FENTANYL CITRATE (PF) 250 MCG/5ML IJ SOLN
INTRAMUSCULAR | Status: AC
  Filled 2018-05-20: qty 5

## 2018-05-20 MED ORDER — ROCURONIUM BROMIDE 10 MG/ML (PF) SYRINGE
PREFILLED_SYRINGE | INTRAVENOUS | Status: DC | PRN
  Administered 2018-05-20: 50 mg via INTRAVENOUS

## 2018-05-20 MED ORDER — SODIUM CHLORIDE 0.9 % IV SOLN
2.0000 g | Freq: Once | INTRAVENOUS | Status: AC
  Administered 2018-05-20: 2 g via INTRAVENOUS
  Filled 2018-05-20: qty 20

## 2018-05-20 MED ORDER — HYDRALAZINE HCL 20 MG/ML IJ SOLN
10.0000 mg | Freq: Once | INTRAMUSCULAR | Status: AC
  Administered 2018-05-20: 10 mg via INTRAVENOUS

## 2018-05-20 MED ORDER — HYDROMORPHONE HCL 1 MG/ML IJ SOLN
1.0000 mg | INTRAMUSCULAR | Status: DC | PRN
  Administered 2018-05-20: 1 mg via INTRAVENOUS
  Filled 2018-05-20: qty 1

## 2018-05-20 MED ORDER — OXYCODONE HCL 5 MG/5ML PO SOLN: 5.0000 mg | Freq: Once | ORAL | Status: DC | PRN

## 2018-05-20 MED ORDER — IOPAMIDOL (ISOVUE-300) INJECTION 61%
INTRAVENOUS | Status: DC | PRN
  Administered 2018-05-20: 2 mL

## 2018-05-20 MED ORDER — LACTATED RINGERS IV SOLN: INTRAVENOUS | Status: DC

## 2018-05-20 MED ORDER — SUGAMMADEX SODIUM 200 MG/2ML IV SOLN
INTRAVENOUS | Status: DC | PRN
  Administered 2018-05-20: 200 mg via INTRAVENOUS

## 2018-05-20 MED ORDER — ACETAMINOPHEN 650 MG RE SUPP: 650.0000 mg | Freq: Four times a day (QID) | RECTAL | Status: DC | PRN

## 2018-05-20 MED ORDER — SCOPOLAMINE 1 MG/3DAYS TD PT72
1.0000 | MEDICATED_PATCH | Freq: Once | TRANSDERMAL | Status: AC
  Administered 2018-05-20: 1.5 mg via TRANSDERMAL
  Filled 2018-05-20: qty 1

## 2018-05-20 MED ORDER — IOPAMIDOL (ISOVUE-300) INJECTION 61%
INTRAVENOUS | Status: AC
  Filled 2018-05-20: qty 50

## 2018-05-20 MED ORDER — OXYCODONE HCL 5 MG PO TABS: 5.0000 mg | ORAL_TABLET | ORAL | Status: DC | PRN

## 2018-05-20 MED ORDER — BUPIVACAINE-EPINEPHRINE (PF) 0.25% -1:200000 IJ SOLN
INTRAMUSCULAR | Status: AC
  Filled 2018-05-20: qty 30

## 2018-05-20 SURGICAL SUPPLY — 19 items
APPLIER CLIP ROT 10 11.4 M/L (STAPLE) ×3
CABLE HIGH FREQUENCY MONO STRZ (ELECTRODE) ×3 IMPLANT
CHLORAPREP W/TINT 26ML (MISCELLANEOUS) ×3 IMPLANT
CLIP APPLIE ROT 10 11.4 M/L (STAPLE) ×2 IMPLANT
COVER MAYO STAND STRL (DRAPES) ×3 IMPLANT
COVER SURGICAL LIGHT HANDLE (MISCELLANEOUS) ×3 IMPLANT
DECANTER SPIKE VIAL GLASS SM (MISCELLANEOUS) ×3 IMPLANT
DRAPE C-ARM 42X120 X-RAY (DRAPES) ×3 IMPLANT
ELECT REM PT RETURN 15FT ADLT (MISCELLANEOUS) ×3 IMPLANT
GLOVE SURG ORTHO 8.0 STRL STRW (GLOVE) ×3 IMPLANT
POUCH SPECIMEN RETRIEVAL 10MM (ENDOMECHANICALS) ×3 IMPLANT
SCISSORS LAP 5X35 DISP (ENDOMECHANICALS) ×3 IMPLANT
SET IRRIG TUBING LAPAROSCOPIC (IRRIGATION / IRRIGATOR) ×3 IMPLANT
SLEEVE XCEL OPT CAN 5 100 (ENDOMECHANICALS) ×3 IMPLANT
SUT VIC AB 4-0 PS2 27 (SUTURE) ×3 IMPLANT
TOWEL OR 17X26 10 PK STRL BLUE (TOWEL DISPOSABLE) ×3 IMPLANT
TROCAR BLADELESS OPT 5 100 (ENDOMECHANICALS) ×3 IMPLANT
TROCAR XCEL BLUNT TIP 100MML (ENDOMECHANICALS) ×3 IMPLANT
TROCAR XCEL NON-BLD 11X100MML (ENDOMECHANICALS) ×3 IMPLANT

## 2018-05-20 NOTE — ED Notes (Signed)
ED TO INPATIENT HANDOFF REPORT  ED Nurse Name and Phone #: Lona Kettle Name/Age/Gender Metro Kung 58 y.o. female Room/Bed: WA11/WA11  Code Status   Code Status: Not on file  Home/SNF/Other Home Patient oriented to: self, place and time Is this baseline? Yes   Triage Complete: Triage complete  Chief Complaint abd pain  Triage Note Pt reports abd pain onset 2100 last night with cramping and bloating noted. Normal BM denies emesis but did have nausea pt reports Hx of gallstones   Allergies Allergies  Allergen Reactions  . Amoxicillin Palpitations    Did it involve swelling of the face/tongue/throat, SOB, or low BP? Yes Did it involve sudden or severe rash/hives, skin peeling, or any reaction on the inside of your mouth or nose? No Did you need to seek medical attention at a hospital or doctor's office? No When did it last happen?unknown  If all above answers are "NO", may proceed with cephalosporin use.     Level of Care/Admitting Diagnosis ED Disposition    ED Disposition Condition Comment   Admit  The patient appears reasonably stabilized for admission considering the current resources, flow, and capabilities available in the ED at this time, and I doubt any other [facility] requiring further screening and/or treatment in the ED prior to admission is  present.       B Medical/Surgery History Past Medical History:  Diagnosis Date  . Anemia   . Gallstones    Past Surgical History:  Procedure Laterality Date  . ABDOMINAL HYSTERECTOMY    . CARDIAC ELECTROPHYSIOLOGY STUDY AND ABLATION    . CESAREAN SECTION     x 2     A IV Location/Drains/Wounds Patient Lines/Drains/Airways Status   Active Line/Drains/Airways    Name:   Placement date:   Placement time:   Site:   Days:   Peripheral IV 05/20/18 Right Antecubital   05/20/18    0255    Antecubital   less than 1          Intake/Output Last 24 hours  Intake/Output Summary (Last 24 hours) at 05/20/2018  0755 Last data filed at 05/20/2018 0437 Gross per 24 hour  Intake 100 ml  Output -  Net 100 ml    Labs/Imaging Results for orders placed or performed during the hospital encounter of 05/20/18 (from the past 48 hour(s))  CBC with Differential/Platelet     Status: Abnormal   Collection Time: 05/20/18  2:54 AM  Result Value Ref Range   WBC 10.6 (H) 4.0 - 10.5 K/uL   RBC 4.35 3.87 - 5.11 MIL/uL   Hemoglobin 13.3 12.0 - 15.0 g/dL   HCT 62.3 76.2 - 83.1 %   MCV 97.0 80.0 - 100.0 fL   MCH 30.6 26.0 - 34.0 pg   MCHC 31.5 30.0 - 36.0 g/dL   RDW 51.7 61.6 - 07.3 %   Platelets 279 150 - 400 K/uL   nRBC 0.0 0.0 - 0.2 %   Neutrophils Relative % 72 %   Neutro Abs 7.8 (H) 1.7 - 7.7 K/uL   Lymphocytes Relative 21 %   Lymphs Abs 2.2 0.7 - 4.0 K/uL   Monocytes Relative 5 %   Monocytes Absolute 0.5 0.1 - 1.0 K/uL   Eosinophils Relative 1 %   Eosinophils Absolute 0.1 0.0 - 0.5 K/uL   Basophils Relative 1 %   Basophils Absolute 0.1 0.0 - 0.1 K/uL   Immature Granulocytes 0 %   Abs Immature Granulocytes 0.04 0.00 - 0.07  K/uL    Comment: Performed at [facility], [address] [place], [place] [address]  Comprehensive metabolic panel     Status: Abnormal   Collection Time: 05/20/18  2:54 AM  Result Value Ref Range   Sodium 137 135 - 145 mmol/L   Potassium 3.9 3.5 - 5.1 mmol/L   Chloride 105 98 - 111 mmol/L   CO2 23 22 - 32 mmol/L   Glucose, Bld 117 (H) 70 - 99 mg/dL   BUN 23 (H) 6 - 20 mg/dL   Creatinine, Ser 5.02 0.44 - 1.00 mg/dL   Calcium 8.7 (L) 8.9 - 10.3 mg/dL   Total Protein 7.5 6.5 - 8.1 g/dL   Albumin 4.1 3.5 - 5.0 g/dL   AST 23 15 - 41 U/L   ALT 21 0 - 44 U/L   Alkaline Phosphatase 84 38 - 126 U/L   Total Bilirubin 0.5 0.3 - 1.2 mg/dL   GFR calc non Af Amer >60 >60 mL/min   GFR calc Af Amer >60 >60 mL/min   Anion gap 9 5 - 15    Comment: Performed at [facility], [address] [place], [place] [address]  Lipase, blood     Status:  None   Collection Time: 05/20/18  2:54 AM  Result Value Ref Range   Lipase 28 11 - 51 U/L    Comment: Performed at [facility], [address], [place], [place] [address]   US Abdomen Limited Ruq  Result Date: 05/20/2018 CLINICAL DATA:  Right upper quadrant pain EXAM: ULTRASOUND ABDOMEN LIMITED RIGHT UPPER QUADRANT COMPARISON:  Right upper quadrant ultrasound 10/04/2017 FINDINGS: Gallbladder: There are 2 nonmobile gallstones at the gallbladder neck, measuring up to 1.9 cm. There is no gallbladder wall thickening or pericholecystic fluid. A positive sonographic Eulah Pont sign was reported by the sonographer. Common bile duct: Diameter: 4 mm Liver: No focal lesion identified. Within normal limits in parenchymal echogenicity. Portal vein is patent on color Doppler imaging with normal direction of blood flow towards the liver. IMPRESSION: Nonmobile gallstones at the gallbladder neck with positive sonographic Murphy sign, compatible with acute cholecystitis. Electronically Signed   By: Deatra Robinson M.D.   On: 05/20/2018 03:04    Pending Labs Unresulted Labs (From admission, onward)   None      Vitals/Pain Today's Vitals   05/20/18 0407 05/20/18 0500 05/20/18 0600 05/20/18 0748  BP:  131/85 117/86 118/78  Pulse:  64 77 72  Resp:  18 14 16   Temp:      TempSrc:      SpO2:  94% 94% 95%  Weight:      Height:      PainSc: 0-No pain 0-No pain      Isolation Precautions No active isolations  Medications Medications  fentaNYL (SUBLIMAZE) injection 100 mcg (has no administration in time range)  fentaNYL (SUBLIMAZE) injection 100 mcg (100 mcg Intravenous Given 05/20/18 0257)  ondansetron (ZOFRAN) injection 4 mg (4 mg Intravenous Given 05/20/18 0257)  cefTRIAXone (ROCEPHIN) 2 g in sodium chloride 0.9 % 100 mL IVPB (0 g Intravenous Stopped 05/20/18 0437)    Mobility walks Low fall risk   Focused Assessments Colicystitis   R Recommendations: See Admitting Provider  Note  Report given to:   Additional Notes: .

## 2018-05-20 NOTE — ED Triage Notes (Addendum)
Pt reports abd pain onset 2100 last night with cramping and bloating noted. Normal BM denies emesis but did have nausea pt reports Hx of gallstones

## 2018-05-20 NOTE — Anesthesia Postprocedure Evaluation (Signed)
Anesthesia Post Note  Patient: Education officer, environmental  Procedure(s) Performed: LAPAROSCOPIC CHOLECYSTECTOMY WITH INTRAOPERATIVE CHOLANGIOGRAM (N/A Abdomen)     Patient location during evaluation: PACU Anesthesia Type: General Level of consciousness: awake and alert Pain management: pain level controlled Vital Signs Assessment: post-procedure vital signs reviewed and stable Respiratory status: spontaneous breathing, nonlabored ventilation, respiratory function stable and patient connected to nasal cannula oxygen Cardiovascular status: blood pressure returned to baseline and stable Postop Assessment: no apparent nausea or vomiting Anesthetic complications: no    Last Vitals:  Vitals:   05/20/18 1100 05/20/18 1122  BP: 140/78 (!) 149/88  Pulse: 88 94  Resp: 18   Temp: 36.6 C 36.8 C  SpO2: 98% 100%    Last Pain:  Vitals:   05/20/18 1131  TempSrc:   PainSc: 9                  Gail Turner S

## 2018-05-20 NOTE — Op Note (Signed)
Procedure Note  Pre-operative Diagnosis:  Biliary colic, chronic cholecystitis, cholelithiasis  Post-operative Diagnosis:  same  Surgeon:  Darnell Level, MD  Assistant:  Manus Rudd, MD   Procedure:  Laparoscopic cholecystectomy with intra-operative cholangiography  Anesthesia:  General  Estimated Blood Loss:  minimal  Drains: none         Specimen: gallbladder to pathology  Indications:  Patient is a 58 year old female who presents to the emergency room with unrelenting right upper quadrant abdominal pain.  Patient has had intermittent symptoms for several years.  She had been told this was gastritis.  Previous abdominal surgery includes cesarean section and abdominal hysterectomy.  In the emergency department the patient was noted to have a borderline elevated white blood cell count and normal liver function test.  Ultrasound of the abdomen showed 2 gallstones present in the neck of the gallbladder which appeared to be nonmobile.  She did have a sonographic Murphy sign.  There is no wall thickening.  There is no pericholecystic fluid.  However pain has only been controlled with intravenous narcotics.  General surgery is called for surgical management.  Procedure Details:  The patient was seen in the pre-op holding area. The risks, benefits, complications, treatment options, and expected outcomes were previously discussed with the patient. The patient agreed with the proposed plan and has signed the informed consent form.  The patient was transported to operating room # 1 at the [facility]. The patient was placed in the supine position on the operating room table. Following induction of general anesthesia, the abdomen was prepped and draped in the usual aseptic fashion.  An incision was made in the skin near the umbilicus. The midline fascia was incised and the peritoneal cavity was entered and a Hasson cannula was introduced under direct vision. The cannula was secured with a  0-Vicryl pursestring suture. Pneumoperitoneum was established with carbon dioxide. Additional cannulae were introduced under direct vision along the right costal margin in the midline, mid-clavicular line, and anterior axillary line.   The gallbladder was identified and the fundus grasped and retracted cephalad. Adhesions were taken down bluntly and the electrocautery was utilized as needed, taking care not to involve any adjacent structures. The infundibulum was grasped and retracted laterally, exposing the peritoneum overlying the triangle of Calot. The peritoneum was incised and structures exposed with blunt dissection. The cystic duct was clearly identified, bluntly dissected circumferentially, and clipped at the neck of the gallbladder.  An incision was made in the cystic duct and the cholangiogram catheter introduced. The catheter was secured using an ligaclip.  Real-time cholangiography was performed using C-arm fluoroscopy.  There was rapid filling of a normal caliber common bile duct.  There was reflux of contrast into the left and right hepatic ductal systems.  There was free flow distally into the duodenum without filling defect or obstruction.  The catheter was removed from the peritoneal cavity.  The cystic duct was then ligated with ligaclips and divided. The cystic artery was identified, dissected circumferentially, ligated with ligaclips, and divided.  The gallbladder was dissected away from the gallbladder bed using the electrocautery for hemostasis. The gallbladder was completely removed from the liver and placed into an endocatch bag. The gallbladder was removed in the endocatch bag through the umbilical port site and submitted to pathology for review.  The right upper quadrant was irrigated and the gallbladder bed was inspected. Hemostasis was achieved with the electrocautery.  Cannulae were removed under direct vision and good hemostasis was noted.  Pneumoperitoneum was released and  the majority of the carbon dioxide evacuated. The umbilical wound was irrigated and the fascia was then closed with the pursestring suture.  Local anesthetic was infiltrated at all port sites. Skin incisions were closed with 4-0 Monocril subcuticular sutures and Dermabond was applied.  Instrument, sponge, and needle counts were correct at the conclusion of the case.  The patient was awakened from anesthesia and brought to the recovery room in stable condition.  The patient tolerated the procedure well.   Darnell Level, MD [facility] Office: [phone]

## 2018-05-20 NOTE — Transfer of Care (Signed)
Immediate Anesthesia Transfer of Care Note  Patient: Gail Turner  Procedure(s) Performed: LAPAROSCOPIC CHOLECYSTECTOMY WITH INTRAOPERATIVE CHOLANGIOGRAM (N/A Abdomen)  Patient Location: PACU  Anesthesia Type:General  Level of Consciousness: awake, alert , oriented and patient cooperative  Airway & Oxygen Therapy: Patient Spontanous Breathing and Patient connected to face mask oxygen  Post-op Assessment: Report given to RN and Post -op Vital signs reviewed and stable  Post vital signs: Reviewed and stable  Last Vitals:  Vitals Value Taken Time  BP 166/104 05/20/2018 10:15 AM  Temp    Pulse 78 05/20/2018 10:17 AM  Resp 22 05/20/2018 10:17 AM  SpO2 100 % 05/20/2018 10:17 AM  Vitals shown include unvalidated device data.  Last Pain:  Vitals:   05/20/18 0500  TempSrc:   PainSc: 0-No pain         Complications: No apparent anesthesia complications

## 2018-05-20 NOTE — Anesthesia Preprocedure Evaluation (Signed)
Anesthesia Evaluation  Patient identified by MRN, date of birth, ID band Patient awake    Reviewed: Allergy & Precautions, H&P , NPO status , Patient's Chart, lab work & pertinent test results  Airway Mallampati: II   Neck ROM: full    Dental   Pulmonary former smoker,    breath sounds clear to auscultation       Cardiovascular negative cardio ROS   Rhythm:regular     Neuro/Psych    GI/Hepatic Gallstones.   Endo/Other  obese  Renal/GU      Musculoskeletal   Abdominal   Peds  Hematology   Anesthesia Other Findings   Reproductive/Obstetrics                             Anesthesia Physical Anesthesia Plan  ASA: II  Anesthesia Plan: General   Post-op Pain Management:    Induction: Intravenous  PONV Risk Score and Plan: 3 and Ondansetron, Dexamethasone, Midazolam and Treatment may vary due to age or medical condition  Airway Management Planned: Oral ETT  Additional Equipment:   Intra-op Plan:   Post-operative Plan: Extubation in OR  Informed Consent: I have reviewed the patients History and Physical, chart, labs and discussed the procedure including the risks, benefits and alternatives for the proposed anesthesia with the patient or authorized representative who has indicated his/her understanding and acceptance.       Plan Discussed with: CRNA, Anesthesiologist and Surgeon  Anesthesia Plan Comments:         Anesthesia Quick Evaluation

## 2018-05-20 NOTE — ED Provider Notes (Signed)
[facility]-EMERGENCY DEPT Provider Note   CSN: [id number] Arrival date & time: 05/19/18  2317    History   Chief Complaint Chief Complaint  Patient presents with  . Abdominal Pain    HPI Gail Turner is a 58 y.o. female.     The history is provided by the patient.  Abdominal Pain  Pain location:  Epigastric and RUQ Pain quality: cramping   Pain severity:  Severe Onset quality:  Gradual Duration:  5 hours Timing:  Constant Progression:  Worsening Chronicity:  New Relieved by:  Nothing Worsened by:  Movement Associated symptoms: nausea   Associated symptoms: no chest pain, no diarrhea, no fever and no vomiting   Patient reports onset of upper abdominal pain several hours ago.  Similar to prior episodes of pain with her gallbladder.  No fevers or vomiting.  No chest pain.   Past Medical History:  Diagnosis Date  . Anemia   . Gallstones     There are no active problems to display for this patient.   Past Surgical History:  Procedure Laterality Date  . ABDOMINAL HYSTERECTOMY    . CARDIAC ELECTROPHYSIOLOGY STUDY AND ABLATION    . CESAREAN SECTION     x 2     OB History   No obstetric history on file.      Home Medications    Prior to Admission medications   Medication Sig Start Date End Date Taking? Authorizing Provider  ondansetron (ZOFRAN ODT) 4 MG disintegrating tablet Take 1 tablet (4 mg total) by mouth every 8 (eight) hours as needed. 05/10/18   McDonald, Mia A, PA-C    Family History Family History  Problem Relation Age of Onset  . Breast cancer Mother   . Ovarian cancer Sister   . Brain cancer Maternal Aunt   . Colon cancer Neg Hx   . Esophageal cancer Neg Hx   . Stomach cancer Neg Hx   . Pancreatic cancer Neg Hx   . Liver disease Neg Hx     Social History Social History   Tobacco Use  . Smoking status: Former Games developer  . Smokeless tobacco: Never Used  Substance Use Topics  . Alcohol use: Yes    Comment: 4-5  drinks weekly  . Drug use: No     Allergies   Amoxicillin   Review of Systems Review of Systems  Constitutional: Negative for fever.  Cardiovascular: Negative for chest pain.  Gastrointestinal: Positive for abdominal pain and nausea. Negative for diarrhea and vomiting.  All other systems reviewed and are negative.    Physical Exam Updated Vital Signs BP (!) 149/118 (BP Location: Right Arm)   Pulse 85   Temp 98.7 F (37.1 C) (Oral)   Resp 18   Ht 1.638 m (5' 4.5")   Wt 95.3 kg   SpO2 99%   BMI 35.49 kg/m   Physical Exam CONSTITUTIONAL: Well developed/well nourished HEAD: Normocephalic/atraumatic EYES: EOMI/PERRL no icterus ENMT: Mucous membranes moist NECK: supple no meningeal signs SPINE/BACK:entire spine nontender CV: S1/S2 noted, no murmurs/rubs/gallops noted LUNGS: Lungs are clear to auscultation bilaterally, no apparent distress ABDOMEN: soft, moderate RUQ and epigastric tenderness, no rebound or guarding, bowel sounds noted throughout abdomen obese GU:no cva tenderness NEURO: Pt is awake/alert/appropriate, moves all extremitiesx4. EXTREMITIES: pulses normal/equal, full ROM SKIN: warm, color normal PSYCH: no abnormalities of mood noted, alert and oriented to situation   ED Treatments / Results  Labs (all labs ordered are listed, but only abnormal results are displayed)  Labs Reviewed  CBC WITH DIFFERENTIAL/PLATELET - Abnormal; Notable for the following components:      Result Value   WBC 10.6 (*)    Neutro Abs 7.8 (*)    All other components within normal limits  COMPREHENSIVE METABOLIC PANEL - Abnormal; Notable for the following components:   Glucose, Bld 117 (*)    BUN 23 (*)    Calcium 8.7 (*)    All other components within normal limits  LIPASE, BLOOD    EKG EKG Interpretation  Date/Time:  Sunday May 20 2018 02:55:47 EDT Ventricular Rate:  72 PR Interval:    QRS Duration: 107 QT Interval:  425 QTC Calculation: 466 R Axis:   95 Text  Interpretation:  Sinus arrhythmia Consider right ventricular hypertrophy No significant change since last tracing Confirmed by Zadie Rhine ([id number]) on 05/20/2018 3:15:07 AM   Radiology US Abdomen Limited Ruq  Result Date: 05/20/2018 CLINICAL DATA:  Right upper quadrant pain EXAM: ULTRASOUND ABDOMEN LIMITED RIGHT UPPER QUADRANT COMPARISON:  Right upper quadrant ultrasound 10/04/2017 FINDINGS: Gallbladder: There are 2 nonmobile gallstones at the gallbladder neck, measuring up to 1.9 cm. There is no gallbladder wall thickening or pericholecystic fluid. A positive sonographic Eulah Pont sign was reported by the sonographer. Common bile duct: Diameter: 4 mm Liver: No focal lesion identified. Within normal limits in parenchymal echogenicity. Portal vein is patent on color Doppler imaging with normal direction of blood flow towards the liver. IMPRESSION: Nonmobile gallstones at the gallbladder neck with positive sonographic Murphy sign, compatible with acute cholecystitis. Electronically Signed   By: Deatra Robinson M.D.   On: 05/20/2018 03:04    Procedures Procedures  Medications Ordered in ED Medications  cefTRIAXone (ROCEPHIN) 2 g in sodium chloride 0.9 % 100 mL IVPB (2 g Intravenous New Bag/Given 05/20/18 0407)  fentaNYL (SUBLIMAZE) injection 100 mcg (100 mcg Intravenous Given 05/20/18 0257)  ondansetron (ZOFRAN) injection 4 mg (4 mg Intravenous Given 05/20/18 0257)     Initial Impression / Assessment and Plan / ED Course  I have reviewed the triage vital signs and the nursing notes.  Pertinent labs & imaging results that were available during my care of the patient were reviewed by me and considered in my medical decision making (see chart for details).        2:50 AM Patient with known history of gallstones presenting with upper abdominal pain and nausea.  She has significant tenderness on exam.  We will proceed with ultrasound imaging and labs to evaluate for cholecystitis 4:11 AM Patient  was found to have acute cholecystitis with a nonmobile stone in her neck of gallbladder. LFTs are reassuring. Discussed with Dr. Gerrit Friends for admission Plan for surgery later today I have started IV antibiotics Patient updated on plan Final Clinical Impressions(s) / ED Diagnoses   Final diagnoses:  RUQ pain  Acute cholecystitis    ED Discharge Orders    None       Zadie Rhine, MD 05/20/18 [phone]

## 2018-05-20 NOTE — Anesthesia Procedure Notes (Signed)
Procedure Name: Intubation Date/Time: 05/20/2018 9:07 AM Performed by: Elyn Peers, CRNA Pre-anesthesia Checklist: Patient identified, Emergency Drugs available, Suction available, Patient being monitored and Timeout performed Patient Re-evaluated:Patient Re-evaluated prior to induction Oxygen Delivery Method: Circle system utilized Preoxygenation: Pre-oxygenation with 100% oxygen Induction Type: IV induction Ventilation: Mask ventilation without difficulty and Oral airway inserted - appropriate to patient size Laryngoscope Size: Miller and 3 Grade View: Grade II Tube type: Oral Tube size: 7.5 mm Number of attempts: 1 Airway Equipment and Method: Stylet Placement Confirmation: ETT inserted through vocal cords under direct vision,  positive ETCO2 and breath sounds checked- equal and bilateral Secured at: 22 cm Tube secured with: Tape Dental Injury: Teeth and Oropharynx as per pre-operative assessment

## 2018-05-20 NOTE — H&P (Signed)
Gail Turner is an 58 y.o. female.   General Surgery [facility]   Chief Complaint: abdominal pain, chronic cholecystitis, cholelithiasis  HPI: Patient is a 58 year old female who presents to the emergency room with unrelenting right upper quadrant abdominal pain.  Patient has had intermittent symptoms for several years.  She had been told this was gastritis.  Previous abdominal surgery includes cesarean section and abdominal hysterectomy.  In the emergency department the patient was noted to have a borderline elevated white blood cell count and normal liver function test.  Ultrasound of the abdomen showed 2 gallstones present in the neck of the gallbladder which appeared to be nonmobile.  She did have a sonographic Murphy sign.  There is no wall thickening.  There is no pericholecystic fluid.  However pain has only been controlled with intravenous narcotics.  General surgery is called for surgical management.  Patient does not have a primary care physician here in [place].  She recently relocated from the [place] area.  She works for [employer].  Past Medical History:  Diagnosis Date  . Anemia   . Gallstones     Past Surgical History:  Procedure Laterality Date  . ABDOMINAL HYSTERECTOMY    . CARDIAC ELECTROPHYSIOLOGY STUDY AND ABLATION    . CESAREAN SECTION     x 2    Family History  Problem Relation Age of Onset  . Breast cancer Mother   . Ovarian cancer Sister   . Brain cancer Maternal Aunt   . Colon cancer Neg Hx   . Esophageal cancer Neg Hx   . Stomach cancer Neg Hx   . Pancreatic cancer Neg Hx   . Liver disease Neg Hx    Social History:  reports that she has quit smoking. She has never used smokeless tobacco. She reports current alcohol use. She reports that she does not use drugs.  Allergies:  Allergies  Allergen Reactions  . Amoxicillin Palpitations    Did it involve swelling of the face/tongue/throat, SOB, or low BP? Yes Did it involve  sudden or severe rash/hives, skin peeling, or any reaction on the inside of your mouth or nose? No Did you need to seek medical attention at a hospital or doctor's office? No When did it last happen?unknown  If all above answers are "NO", may proceed with cephalosporin use.     (Not in a hospital admission)   Results for orders placed or performed during the hospital encounter of 05/20/18 (from the past 48 hour(s))  CBC with Differential/Platelet     Status: Abnormal   Collection Time: 05/20/18  2:54 AM  Result Value Ref Range   WBC 10.6 (H) 4.0 - 10.5 K/uL   RBC 4.35 3.87 - 5.11 MIL/uL   Hemoglobin 13.3 12.0 - 15.0 g/dL   HCT 88.7 19.5 - 97.4 %   MCV 97.0 80.0 - 100.0 fL   MCH 30.6 26.0 - 34.0 pg   MCHC 31.5 30.0 - 36.0 g/dL   RDW 71.8 55.0 - 15.8 %   Platelets 279 150 - 400 K/uL   nRBC 0.0 0.0 - 0.2 %   Neutrophils Relative % 72 %   Neutro Abs 7.8 (H) 1.7 - 7.7 K/uL   Lymphocytes Relative 21 %   Lymphs Abs 2.2 0.7 - 4.0 K/uL   Monocytes Relative 5 %   Monocytes Absolute 0.5 0.1 - 1.0 K/uL   Eosinophils Relative 1 %   Eosinophils Absolute 0.1 0.0 - 0.5 K/uL   Basophils Relative  1 %   Basophils Absolute 0.1 0.0 - 0.1 K/uL   Immature Granulocytes 0 %   Abs Immature Granulocytes 0.04 0.00 - 0.07 K/uL    Comment: Performed at [facility], [address] [place], [place] [address]  Comprehensive metabolic panel     Status: Abnormal   Collection Time: 05/20/18  2:54 AM  Result Value Ref Range   Sodium 137 135 - 145 mmol/L   Potassium 3.9 3.5 - 5.1 mmol/L   Chloride 105 98 - 111 mmol/L   CO2 23 22 - 32 mmol/L   Glucose, Bld 117 (H) 70 - 99 mg/dL   BUN 23 (H) 6 - 20 mg/dL   Creatinine, Ser 6.04 0.44 - 1.00 mg/dL   Calcium 8.7 (L) 8.9 - 10.3 mg/dL   Total Protein 7.5 6.5 - 8.1 g/dL   Albumin 4.1 3.5 - 5.0 g/dL   AST 23 15 - 41 U/L   ALT 21 0 - 44 U/L   Alkaline Phosphatase 84 38 - 126 U/L   Total Bilirubin 0.5 0.3 - 1.2 mg/dL   GFR calc non Af  Amer >60 >60 mL/min   GFR calc Af Amer >60 >60 mL/min   Anion gap 9 5 - 15    Comment: Performed at [facility], [address], [place], [place] [address]  Lipase, blood     Status: None   Collection Time: 05/20/18  2:54 AM  Result Value Ref Range   Lipase 28 11 - 51 U/L    Comment: Performed at [facility], [address], [place], [place] [address]   US Abdomen Limited Ruq  Result Date: 05/20/2018 CLINICAL DATA:  Right upper quadrant pain EXAM: ULTRASOUND ABDOMEN LIMITED RIGHT UPPER QUADRANT COMPARISON:  Right upper quadrant ultrasound 10/04/2017 FINDINGS: Gallbladder: There are 2 nonmobile gallstones at the gallbladder neck, measuring up to 1.9 cm. There is no gallbladder wall thickening or pericholecystic fluid. A positive sonographic Eulah Pont sign was reported by the sonographer. Common bile duct: Diameter: 4 mm Liver: No focal lesion identified. Within normal limits in parenchymal echogenicity. Portal vein is patent on color Doppler imaging with normal direction of blood flow towards the liver. IMPRESSION: Nonmobile gallstones at the gallbladder neck with positive sonographic Murphy sign, compatible with acute cholecystitis. Electronically Signed   By: Deatra Robinson M.D.   On: 05/20/2018 03:04    Review of Systems  Constitutional: Negative for chills, diaphoresis and fever.  HENT: Negative.   Eyes: Negative.   Respiratory: Negative.   Cardiovascular: Positive for palpitations.  Gastrointestinal: Positive for abdominal pain.  Genitourinary: Negative.   Musculoskeletal: Negative.   Skin: Negative.   Neurological: Negative.   Endo/Heme/Allergies: Negative.   Psychiatric/Behavioral: Negative.   All other systems reviewed and are negative.   Blood pressure 117/86, pulse 77, temperature 98.7 F (37.1 C), temperature source Oral, resp. rate 14, height 5' 4.5" (1.638 m), weight 95.3 kg, SpO2 94 %. Physical Exam  Constitutional: She is oriented to  person, place, and time. She appears well-developed and well-nourished. No distress.  HENT:  Head: Normocephalic and atraumatic.  Mouth/Throat: Oropharynx is clear and moist. No oropharyngeal exudate.  Eyes: Pupils are equal, round, and reactive to light. Conjunctivae are normal. No scleral icterus.  Neck: Normal range of motion. Neck supple. No tracheal deviation present. No thyromegaly present.  Cardiovascular: Normal rate, regular rhythm and normal heart sounds.  No murmur heard. Respiratory: Effort normal. No respiratory distress. She has no wheezes.  GI: Soft. Bowel sounds are normal. She exhibits no distension and no mass. There is no abdominal tenderness. There is no rebound and no guarding.  Musculoskeletal: Normal range of motion.        General: No deformity or edema.  Neurological: She is alert and oriented to person, place, and time.  Skin: Skin is warm and dry. She is not diaphoretic.  Psychiatric: She has a normal mood and affect. Her behavior is normal.     Assessment/Plan Unrelenting biliary colic, chronic cholecystitis, cholelithiasis  I discussed the above findings with the patient and her family.  We discussed laparoscopic cholecystectomy with intraoperative cholangiography.  We discussed the location of the surgical incisions.  We discussed the possibility of conversion to open surgery.  We discussed the hospital stay to be anticipated in the postoperative recovery.  Patient understands and wishes to proceed with surgery this morning.  The risks and benefits of the procedure have been discussed at length with the patient.  The patient understands the proposed procedure, potential alternative treatments, and the course of recovery to be expected.  All of the patient's questions have been answered at this time.  The patient wishes to proceed with surgery.  Darnell Level, MD [facility] Surgery Office: [phone]    Darnell Level, MD 05/20/2018, 7:43 AM

## 2018-05-21 ENCOUNTER — Encounter (HOSPITAL_COMMUNITY): Payer: Self-pay | Admitting: Surgery

## 2018-05-21 MED ORDER — IBUPROFEN 200 MG PO TABS: 600.0000 mg | ORAL_TABLET | Freq: Four times a day (QID) | ORAL | Status: DC | PRN

## 2018-05-21 MED ORDER — ACETAMINOPHEN 500 MG PO TABS: 1000.0000 mg | ORAL_TABLET | Freq: Three times a day (TID) | ORAL | Status: DC

## 2018-05-21 MED ORDER — IBUPROFEN 200 MG PO TABS: ORAL_TABLET | ORAL | Status: DC

## 2018-05-21 MED ORDER — OXYCODONE HCL 5 MG PO TABS: ORAL_TABLET | ORAL | 0 refills | Status: DC

## 2018-05-21 MED ORDER — ACETAMINOPHEN 500 MG PO TABS: ORAL_TABLET | ORAL | 0 refills | Status: DC

## 2018-05-21 NOTE — Discharge Instructions (Signed)
[facility] ______[facility] °LAPAROSCOPIC SURGERY: POST OP INSTRUCTIONS °Always review your discharge instruction sheet given to you by the facility where your surgery was performed. °IF YOU HAVE DISABILITY OR FAMILY LEAVE FORMS, YOU MUST BRING THEM TO THE OFFICE FOR PROCESSING.   °DO NOT GIVE THEM TO YOUR DOCTOR. ° °1. A prescription for pain medication may be given to you upon discharge.  Take your pain medication as prescribed, if needed.  If narcotic pain medicine is not needed, then you may take acetaminophen (Tylenol) or ibuprofen (Advil) as needed. °2. Take your usually prescribed medications unless otherwise directed. °3. If you need a refill on your pain medication, please contact your pharmacy.  They will contact our office to request authorization. Prescriptions will not be filled after 5pm or on week-ends. °4. You should follow a light diet the first few days after arrival home, such as soup and crackers, etc.  Be sure to include lots of fluids daily. °5. Most patients will experience some swelling and bruising in the area of the incisions.  Ice packs will help.  Swelling and bruising can take several days to resolve.  °6. It is common to experience some constipation if taking pain medication after surgery.  Increasing fluid intake and taking a stool softener (such as Colace) will usually help or prevent this problem from occurring.  A mild laxative (Milk of Magnesia or Miralax) should be taken according to package instructions if there are no bowel movements after 48 hours. °7. Unless discharge instructions indicate otherwise, you may remove your bandages 24-48 hours after surgery, and you may shower at that time.  You may have steri-strips (small skin tapes) in place directly over the incision.  These strips should be left on the skin for 7-10 days.  If your surgeon used skin glue on the incision, you may shower in 24 hours.  The glue will flake off over the next 2-3 weeks.  Any sutures or  staples will be removed at the office during your follow-up visit. °8. ACTIVITIES:  You may resume regular (light) daily activities beginning the next day--such as daily self-care, walking, climbing stairs--gradually increasing activities as tolerated.  You may have sexual intercourse when it is comfortable.  Refrain from any heavy lifting or straining until approved by your doctor. °a. You may drive when you are no longer taking prescription pain medication, you can comfortably wear a seatbelt, and you can safely maneuver your car and apply brakes. °b. RETURN TO WORK:  __________________________________________________________ °9. You should see your doctor in the office for a follow-up appointment approximately 2-3 weeks after your surgery.  Make sure that you call for this appointment within a day or two after you arrive home to insure a convenient appointment time. °10. OTHER INSTRUCTIONS: __________________________________________________________________________________________________________________________ __________________________________________________________________________________________________________________________ °WHEN TO CALL YOUR DOCTOR: °1. Fever over 101.0 °2. Inability to urinate °3. Continued bleeding from incision. °4. Increased pain, redness, or drainage from the incision. °5. Increasing abdominal pain ° °The clinic staff is available to answer your questions during regular business hours.  Please don’t hesitate to call and ask to speak to one of the nurses for clinical concerns.  If you have a medical emergency, go to the nearest emergency room or call 911.  A surgeon from [facility] is always on call at the hospital. °[address], [address], [place], [place]  [address] ? [address] [address], [place], [place]   [address] °[phone] ? [phone] ? FAX [phone] °Web site:   [email or web address] °

## 2018-05-21 NOTE — Progress Notes (Signed)
Discharge and medication instructions reviewed with patient. Questions answered and patient denies further questions. No prescriptions given to patient. Daughter is coming to drive her home. Lina Sar, RN

## 2018-05-21 NOTE — Progress Notes (Signed)
1 Day Post-Op    CC: Abdominal pain  Subjective: Patient is doing well this a.m.  She has been up and shower.  Tolerating her diet well.  She is only use plain Tylenol for pain so far.  Port sites all look good.  Objective: Vital signs in last 24 hours: Temp:  [97.8 F (36.6 C)-98.9 F (37.2 C)] 98.3 F (36.8 C) (03/16 0921) Pulse Rate:  [60-94] 60 (03/16 0921) Resp:  [16-22] 18 (03/16 0921) BP: (118-168)/(73-104) 118/83 (03/16 0921) SpO2:  [95 %-100 %] 97 % (03/16 0921) Last BM Date: 05/20/18 For 20 p.o. 1811 IV 1850 urine Afebrile vital signs are stable No labs this a.m. Intraoperative cholangiogram: No evidence of choledocholithiasis. Intake/Output from previous day: 03/15 0701 - 03/16 0700 In: 2231.8 [P.O.:420; I.V.:1811.8] Out: 1860 [Urine:1850; Blood:10] Intake/Output this shift: No intake/output data recorded.  General appearance: alert, cooperative and no distress Resp: clear to auscultation bilaterally GI: Soft, sore, port sites all look good.  Tolerating soft diet.  Lab Results:  Recent Labs    05/20/18 0254  WBC 10.6*  HGB 13.3  HCT 42.2  PLT 279    BMET Recent Labs    05/20/18 0254  NA 137  K 3.9  CL 105  CO2 23  GLUCOSE 117*  BUN 23*  CREATININE 0.62  CALCIUM 8.7*   PT/INR No results for input(s): LABPROT, INR in the last 72 hours.  Recent Labs  Lab 05/20/18 0254  AST 23  ALT 21  ALKPHOS 84  BILITOT 0.5  PROT 7.5  ALBUMIN 4.1     Lipase     Component Value Date/Time   LIPASE 28 05/20/2018 0254   Prior to Admission medications   Medication Sig Start Date End Date Taking? Authorizing Provider  ondansetron (ZOFRAN ODT) 4 MG disintegrating tablet Take 1 tablet (4 mg total) by mouth every 8 (eight) hours as needed. Patient taking differently: Take 4 mg by mouth every 8 (eight) hours as needed for nausea or vomiting.  05/10/18  Yes McDonald, Mia A, PA-C      Medications:   . dextrose 5 % and 0.45 % NaCl with KCl 20 mEq/L 50  mL/hr at 05/21/18 4656   Anti-infectives (From admission, onward)   Start     Dose/Rate Route Frequency Ordered Stop   05/20/18 0330  cefTRIAXone (ROCEPHIN) 2 g in sodium chloride 0.9 % 100 mL IVPB     2 g 200 mL/hr over 30 Minutes Intravenous  Once 05/20/18 0315 05/20/18 0437      Assessment/Plan Hx anemia Hx abdominal hysterectomy/C-section x2    Biliary colic, chronic cholecystitis, cholelithiasis Laparoscopic cholecystectomy with intraoperative cholangiogram, 05/20/18, Dr. Darnell Level  FEN: IV fluids/regular diet ID: Rocephin preop DVT: SCDs only Follow-up: [facility]  Plan: Discharge home later this a.m.  LOS: 0 days    Gail Turner 05/21/2018 [phone]

## 2018-05-21 NOTE — Discharge Summary (Signed)
Physician Discharge Summary  Patient ID: Gail Turner MRN: [id number] DOB/AGE: January 11, 1961 58 y.o.  Admit date: 05/20/2018 Discharge date: 05/24/2018  Admission Diagnoses:  Unrelenting biliary colic, chronic cholecystitis, cholelithiasis Hx anemia  Discharge Diagnoses:   Same  Principal Problem:   Cholelithiasis with chronic cholecystitis Active Problems:   Biliary colic   PROCEDURES: Laparoscopic cholecystectomy with intraoperative cholangiogram 05/20/2018 Dr. Alean Rinne Course:  Patient is a 58 year old female who presents to the emergency room with unrelenting right upper quadrant abdominal pain.  Patient has had intermittent symptoms for several years.  She had been told this was gastritis.  Previous abdominal surgery includes cesarean section and abdominal hysterectomy.  In the emergency department the patient was noted to have a borderline elevated white blood cell count and normal liver function test.  Ultrasound of the abdomen showed 2 gallstones present in the neck of the gallbladder which appeared to be nonmobile.  She did have a sonographic Murphy sign.  There is no wall thickening.  There is no pericholecystic fluid.  However pain has only been controlled with intravenous narcotics.  General surgery is called for surgical management.  Patient does not have a primary care physician here in [place].  She recently relocated from the [place] area.  She works for [employer].  Patient was seen in the emergency department and admitted by Dr. Gerrit Friends.  She was taken to the operating room later that day and underwent laparoscopic cholecystectomy with intraoperative cholangiogram.  Cholangiogram showed no evidence of choledocholithiasis.  Return to the floor.  Diet was advanced she was mobilized and she was ready for discharge on the first postoperative day.    CBC Latest Ref Rng & Units 05/20/2018 05/10/2018 10/18/2017  WBC 4.0 - 10.5 K/uL 10.6(H) 10.4 9.0  Hemoglobin 12.0  - 15.0 g/dL 43.2 76.1 47.0  Hematocrit 36.0 - 46.0 % 42.2 44.5 43.5  Platelets 150 - 400 K/uL 279 294 291   CMP Latest Ref Rng & Units 05/20/2018 05/10/2018 10/18/2017  Glucose 70 - 99 mg/dL 929(V) 747(B) 403(J)  BUN 6 - 20 mg/dL 09(U) 15 19  Creatinine 0.44 - 1.00 mg/dL 4.38 3.81 8.40  Sodium 135 - 145 mmol/L 137 139 143  Potassium 3.5 - 5.1 mmol/L 3.9 3.4(L) 3.7  Chloride 98 - 111 mmol/L 105 106 105  CO2 22 - 32 mmol/L 23 25 28   Calcium 8.9 - 10.3 mg/dL 3.7(V) 9.1 9.7  Total Protein 6.5 - 8.1 g/dL 7.5 7.7 -  Total Bilirubin 0.3 - 1.2 mg/dL 0.5 0.4 -  Alkaline Phos 38 - 126 U/L 84 83 -  AST 15 - 41 U/L 23 28 -  ALT 0 - 44 U/L 21 21 -       Disposition:     Allergies as of 05/21/2018      Reactions   Amoxicillin Palpitations   Did it involve swelling of the face/tongue/throat, SOB, or low BP? Yes Did it involve sudden or severe rash/hives, skin peeling, or any reaction on the inside of your mouth or nose? No Did you need to seek medical attention at a hospital or doctor's office? No When did it last happen?unknown  If all above answers are "NO", may proceed with cephalosporin use.      Medication List    TAKE these medications   acetaminophen 500 MG tablet Commonly known as:  TYLENOL You can take 1000 mg every 8 hours for pain.  This is your first-line pain medication.  You can use it for  couple days and then see how you do without it.  You can buy this over-the-counter at any drugstore without a prescription.  Do not take more than 4000 mg of Tylenol(acetaminophen) it can harm your liver.   ibuprofen 200 MG tablet Commonly known as:  ADVIL,MOTRIN You can take 2 to 3 tablets every 6 hours as needed for pain.  You can start this 2 or 3 hours after you have taken your initial Tylenol.  This is your second medicine to use for pain control.  You can alternate this with the plain Tylenol. You can buy this over-the-counter without a prescription at any drugstore.  Do not  exceed 3 tablets every 6 hours for pain.   ondansetron 4 MG disintegrating tablet Commonly known as:  Zofran ODT Take 1 tablet (4 mg total) by mouth every 8 (eight) hours as needed. What changed:  reasons to take this   oxyCODONE 5 MG immediate release tablet Commonly known as:  Oxy IR/ROXICODONE You can take 1 tablet every 6 hours for pain not relieved by plain Tylenol and ibuprofen.  We will not refill this prescription.      Follow-up Information    Sur[facility], [facility] Follow up.   Specialty:  General Surgery Why:  The office will call with your appointment in the next 24 hours.  If they do not call you can call and ask for an appointment with the [facility] clinic.  Be at the office 30 minutes early for check-in bring photo ID and insurance information. Contact information: [address] [place] [address] [phone]           Signed: Sherrie George 05/24/2018, 1:56 PM

## 2018-07-05 ENCOUNTER — Emergency Department (HOSPITAL_COMMUNITY): Payer: Self-pay

## 2018-07-05 ENCOUNTER — Emergency Department (HOSPITAL_COMMUNITY)
Admission: EM | Admit: 2018-07-05 | Discharge: 2018-07-05 | Disposition: A | Discharge status: Home / Self Care | Payer: Self-pay | Attending: Emergency Medicine | Admitting: Emergency Medicine

## 2018-07-05 ENCOUNTER — Other Ambulatory Visit: Payer: Self-pay

## 2018-07-05 ENCOUNTER — Encounter (HOSPITAL_COMMUNITY): Payer: Self-pay | Admitting: Emergency Medicine

## 2018-07-05 DIAGNOSIS — R197 Diarrhea, unspecified: Secondary | ICD-10-CM | POA: Insufficient documentation

## 2018-07-05 DIAGNOSIS — Z87891 Personal history of nicotine dependence: Secondary | ICD-10-CM | POA: Insufficient documentation

## 2018-07-05 DIAGNOSIS — Z79899 Other long term (current) drug therapy: Secondary | ICD-10-CM | POA: Insufficient documentation

## 2018-07-05 DIAGNOSIS — Z7982 Long term (current) use of aspirin: Secondary | ICD-10-CM | POA: Insufficient documentation

## 2018-07-05 DIAGNOSIS — R1013 Epigastric pain: Secondary | ICD-10-CM | POA: Insufficient documentation

## 2018-07-05 LAB — CBC WITH DIFFERENTIAL/PLATELET
Abs Immature Granulocytes: 0.03 10*3/uL (ref 0.00–0.07)
Basophils Absolute: 0.1 10*3/uL (ref 0.0–0.1)
Basophils Relative: 1 %
Eosinophils Absolute: 0.2 10*3/uL (ref 0.0–0.5)
Eosinophils Relative: 3 %
HCT: 47 % — ABNORMAL HIGH (ref 36.0–46.0)
Hemoglobin: 15.4 g/dL — ABNORMAL HIGH (ref 12.0–15.0)
Immature Granulocytes: 0 %
Lymphocytes Relative: 29 %
Lymphs Abs: 2.5 10*3/uL (ref 0.7–4.0)
MCH: 31.1 pg (ref 26.0–34.0)
MCHC: 32.8 g/dL (ref 30.0–36.0)
MCV: 94.9 fL (ref 80.0–100.0)
Monocytes Absolute: 0.6 10*3/uL (ref 0.1–1.0)
Monocytes Relative: 7 %
Neutro Abs: 5.2 10*3/uL (ref 1.7–7.7)
Neutrophils Relative %: 60 %
Platelets: 329 10*3/uL (ref 150–400)
RBC: 4.95 MIL/uL (ref 3.87–5.11)
RDW: 13.1 % (ref 11.5–15.5)
WBC: 8.5 10*3/uL (ref 4.0–10.5)
nRBC: 0 % (ref 0.0–0.2)

## 2018-07-05 LAB — COMPREHENSIVE METABOLIC PANEL
ALT: 27 U/L (ref 0–44)
AST: 31 U/L (ref 15–41)
Albumin: 4.3 g/dL (ref 3.5–5.0)
Alkaline Phosphatase: 82 U/L (ref 38–126)
Anion gap: 7 (ref 5–15)
BUN: 14 mg/dL (ref 6–20)
CO2: 26 mmol/L (ref 22–32)
Calcium: 9.3 mg/dL (ref 8.9–10.3)
Chloride: 108 mmol/L (ref 98–111)
Creatinine, Ser: 0.8 mg/dL (ref 0.44–1.00)
GFR calc Af Amer: >60 mL/min (ref >60)
GFR calc non Af Amer: >60 mL/min (ref >60)
Glucose, Bld: 96 mg/dL (ref 70–99)
Potassium: 4 mmol/L (ref 3.5–5.1)
Sodium: 141 mmol/L (ref 135–145)
Total Bilirubin: 0.9 mg/dL (ref 0.3–1.2)
Total Protein: 8.1 g/dL (ref 6.5–8.1)

## 2018-07-05 LAB — URINALYSIS, ROUTINE W REFLEX MICROSCOPIC
Bilirubin Urine: NEGATIVE
Glucose, UA: NEGATIVE mg/dL
Hgb urine dipstick: NEGATIVE
Ketones, ur: NEGATIVE mg/dL
Leukocytes,Ua: NEGATIVE
Nitrite: NEGATIVE
Protein, ur: NEGATIVE mg/dL
Specific Gravity, Urine: 1.013 (ref 1.005–1.030)
pH: 6 (ref 5.0–8.0)

## 2018-07-05 LAB — LIPASE, BLOOD: Lipase: 23 U/L (ref 11–51)

## 2018-07-05 MED ORDER — ALUM & MAG HYDROXIDE-SIMETH 200-200-20 MG/5ML PO SUSP
15.0000 mL | Freq: Once | ORAL | Status: AC
  Administered 2018-07-05: 15 mL via ORAL
  Filled 2018-07-05: qty 30

## 2018-07-05 MED ORDER — OMEPRAZOLE 20 MG PO CPDR: 20.0000 mg | DELAYED_RELEASE_CAPSULE | Freq: Every day | ORAL | 0 refills | Status: DC

## 2018-07-05 MED ORDER — LIDOCAINE VISCOUS HCL 2 % MT SOLN
15.0000 mL | Freq: Once | OROMUCOSAL | Status: AC
  Administered 2018-07-05: 12:00:00 15 mL via OROMUCOSAL
  Filled 2018-07-05: qty 15

## 2018-07-05 NOTE — ED Notes (Signed)
Pt made aware that [place] will be about 40 mins

## 2018-07-05 NOTE — Discharge Instructions (Addendum)
You can take the omeprazole as prescribed for your abdominal discomfort. (zegerid is the same medication) You can use a maalox/mylanta as well for symptoms relief.  Follow up with your primary care provider.  If you develop high fever, severely worsening constant abdominal pain, uncontrollable vomiting, report back to the ER.

## 2018-07-05 NOTE — ED Triage Notes (Signed)
Pt reports had gallbladder surgery here 6 weeks ago. Since Sunday having epigastric pains with nausea, belching and diarrhea.

## 2018-07-05 NOTE — ED Provider Notes (Signed)
[facility]-EMERGENCY DEPT Provider Note   CSN: [id number] Arrival date & time: 07/05/18  0906    History   Chief Complaint Chief Complaint  Patient presents with  . Abdominal Pain  . Diarrhea    HPI Gail Turner is a 58 y.o. female with history of recent laparoscopic cholecystectomy on 05/20/2018 by Dr. Gerrit Friends due to cholelithiasis with chronic cholecystitis, presenting to the emergency department with complaint of gradual onset of intermittent epigastric abdominal pain that began on Sunday.  Patient states pain is sharp, comes and goes frequently throughout the day, with associated nausea and watery diarrhea.  Patient states the symptoms are very similar to her prior symptoms associated with cholelithiasis.  She also feels like she may have a little bit of heartburn.  She states she had diarrhea for about 2 weeks following her cholecystectomy, however after that time until Sunday she was feeling rather well.  She states Sunday her symptoms began and not improving.  Has associated fever, vomiting, urinary symptoms.  She does not eat really fatty or greasy foods.  She endorses occasional alcohol use, she had 1 alcoholic drink on Tuesday of this week.     The history is provided by the patient and medical records.    Past Medical History:  Diagnosis Date  . Anemia   . Gallstones     Patient Active Problem List   Diagnosis Date Noted  . Biliary colic 05/20/2018  . Cholelithiasis with chronic cholecystitis 05/20/2018    Past Surgical History:  Procedure Laterality Date  . ABDOMINAL HYSTERECTOMY    . CARDIAC ELECTROPHYSIOLOGY STUDY AND ABLATION    . CESAREAN SECTION     x 2  . CHOLECYSTECTOMY N/A 05/20/2018   Procedure: LAPAROSCOPIC CHOLECYSTECTOMY WITH INTRAOPERATIVE CHOLANGIOGRAM;  Surgeon: Darnell Level, MD;  Location: [facility] ORS;  Service: General;  Laterality: N/A;     OB History   No obstetric history on file.      Home Medications    Prior to  Admission medications   Medication Sig Start Date End Date Taking? Authorizing Provider  aspirin 325 MG tablet Take 325 mg by mouth daily as needed for mild pain or headache.   Yes [provider]  OVER THE COUNTER MEDICATION Take 1 tablet by mouth as needed (acid reflux). OTC antiacid   Yes [provider]  acetaminophen (TYLENOL) 500 MG tablet You can take 1000 mg every 8 hours for pain.  This is your first-line pain medication.  You can use it for couple days and then see how you do without it.  You can buy this over-the-counter at any drugstore without a prescription.  Do not take more than 4000 mg of Tylenol(acetaminophen) it can harm your liver. Patient not taking: Reported on 07/05/2018 05/21/18   Sherrie George, PA-C  ibuprofen (ADVIL,MOTRIN) 200 MG tablet You can take 2 to 3 tablets every 6 hours as needed for pain.  You can start this 2 or 3 hours after you have taken your initial Tylenol.  This is your second medicine to use for pain control.  You can alternate this with the plain Tylenol. You can buy this over-the-counter without a prescription at any drugstore.  Do not exceed 3 tablets every 6 hours for pain. Patient not taking: Reported on 07/05/2018 05/21/18   Sherrie George, PA-C  omeprazole (PRILOSEC) 20 MG capsule Take 1 capsule (20 mg total) by mouth daily. 07/05/18   Brnadon Eoff, [place] N, PA-C  ondansetron (ZOFRAN ODT) 4 MG disintegrating  tablet Take 1 tablet (4 mg total) by mouth every 8 (eight) hours as needed. Patient not taking: Reported on 07/05/2018 05/10/18   McDonald, Pedro EarlsMia A, PA-C  oxyCODONE (OXY IR/ROXICODONE) 5 MG immediate release tablet You can take 1 tablet every 6 hours for pain not relieved by plain Tylenol and ibuprofen.  We will not refill this prescription. Patient not taking: Reported on 07/05/2018 05/21/18   Sherrie GeorgeJennings, Willard, PA-C    Family History Family History  Problem Relation Age of Onset  . Breast cancer Mother   . Ovarian cancer Sister    . Brain cancer Maternal Aunt   . Colon cancer Neg Hx   . Esophageal cancer Neg Hx   . Stomach cancer Neg Hx   . Pancreatic cancer Neg Hx   . Liver disease Neg Hx     Social History Social History   Tobacco Use  . Smoking status: Former Games developermoker  . Smokeless tobacco: Never Used  Substance Use Topics  . Alcohol use: Yes    Comment: 4-5 drinks weekly  . Drug use: No     Allergies   Amoxicillin   Review of Systems Review of Systems  All other systems reviewed and are negative.    Physical Exam Updated Vital Signs BP (!) 143/94 (BP Location: Right Arm)   Pulse 66   Temp 98 F (36.7 C) (Oral)   Resp 18   SpO2 98%   Physical Exam Vitals signs and nursing note reviewed.  Constitutional:      General: She is not in acute distress.    Appearance: She is well-developed. She is not ill-appearing.  HENT:     Head: Normocephalic and atraumatic.     Mouth/Throat:     Mouth: Mucous membranes are moist.  Eyes:     Conjunctiva/sclera: Conjunctivae normal.  Cardiovascular:     Rate and Rhythm: Normal rate and regular rhythm.  Pulmonary:     Effort: Pulmonary effort is normal. No respiratory distress.     Breath sounds: Normal breath sounds.  Abdominal:     General: A surgical scar is present. Bowel sounds are normal.     Palpations: Abdomen is soft.     Tenderness: There is abdominal tenderness in the right upper quadrant and epigastric area. There is no guarding or rebound.     Comments: Surgical scars appear to be healed well.  Skin:    General: Skin is warm.  Neurological:     Mental Status: She is alert.  Psychiatric:        Behavior: Behavior normal.      ED Treatments / Results  Labs (all labs ordered are listed, but only abnormal results are displayed) Labs Reviewed  CBC WITH DIFFERENTIAL/PLATELET - Abnormal; Notable for the following components:      Result Value   Hemoglobin 15.4 (*)    HCT 47.0 (*)    All other components within normal limits   LIPASE, BLOOD  COMPREHENSIVE METABOLIC PANEL  URINALYSIS, ROUTINE W REFLEX MICROSCOPIC    EKG None  Radiology [place] Abdomen Limited Ruq  Result Date: 07/05/2018 CLINICAL DATA:  Epigastric abdominal pain. EXAM: ULTRASOUND ABDOMEN LIMITED RIGHT UPPER QUADRANT COMPARISON:  Right upper quadrant ultrasound dated May 20, 2018. FINDINGS: Gallbladder: Surgically absent. Common bile duct: Diameter: 6 mm, normal. Liver: No focal lesion identified. Increased in parenchymal echogenicity. Portal vein is patent on color Doppler imaging with normal direction of blood flow towards the liver. IMPRESSION: 1. No acute abnormality. 2. Hepatic steatosis. Electronically  Signed   By: Obie Dredge M.D.   On: 07/05/2018 11:01    Procedures Procedures (including critical care time)  Medications Ordered in ED Medications  alum & mag hydroxide-simeth (MAALOX/MYLANTA) 200-200-20 MG/5ML suspension 15 mL (15 mLs Oral Given 07/05/18 1143)  lidocaine (XYLOCAINE) 2 % viscous mouth solution 15 mL (15 mLs Mouth/Throat Given 07/05/18 1143)     Initial Impression / Assessment and Plan / ED Course  I have reviewed the triage vital signs and the nursing notes.  Pertinent labs & imaging results that were available during my care of the patient were reviewed by me and considered in my medical decision making (see chart for details).        Patient status post upper laparoscopic cholecystectomy 6 weeks ago by Dr.Gerkin.  Patient presenting with intermittent episodes of epigastric abdominal pain since Sunday, which patient reports similar symptoms as prior to cholecystectomy.  No fevers or vomiting.  Abdomen is soft with some mild epigastric tenderness, no guarding or rebound.  Patient is not currently having pain on evaluation.  Labs and right upper quadrant ultrasound obtained, with suspicion for possible retained stone in common bile duct.  Labs without leukocytosis, normal renal and hepatic function.  No indication for  obstruction.  Right upper quadrant ultrasound revealing normal diameter common bile duct, no stones visualized.  Patient discussed with and evaluated by Dr. Steinl.  Will discharge at this time with recommendation to treat symptoms with antacids and follow-up with PCP.  Strict return precautions discussed.  Patient verbalized understanding and agrees with care plan for discharge.  Discussed results, findings, treatment and follow up. Patient advised of return precautions. Patient verbalized understanding and agreed with plan.   Final Clinical Impressions(s) / ED Diagnoses   Final diagnoses:  Epigastric abdominal pain    ED Discharge Orders         Ordered    omeprazole (PRILOSEC) 20 MG capsule  Daily     04 /30/20 1130           Nelda Luckey, [place] N, [place] 07/05/18 1209    Cathren Laine, MD 07/05/18 1426

## 2018-07-05 NOTE — ED Notes (Signed)
[place] being done now

## 2018-09-26 ENCOUNTER — Ambulatory Visit: Payer: Self-pay | Admitting: Physician Assistant

## 2019-02-11 ENCOUNTER — Other Ambulatory Visit: Payer: Self-pay

## 2019-02-11 DIAGNOSIS — Z20822 Contact with and (suspected) exposure to covid-19: Secondary | ICD-10-CM

## 2019-02-12 LAB — NOVEL CORONAVIRUS, NAA: SARS-CoV-2, NAA: NOT DETECTED

## 2019-03-15 DIAGNOSIS — I10 Essential (primary) hypertension: Secondary | ICD-10-CM | POA: Insufficient documentation

## 2019-03-15 DIAGNOSIS — Z90710 Acquired absence of both cervix and uterus: Secondary | ICD-10-CM | POA: Insufficient documentation

## 2019-03-15 DIAGNOSIS — K219 Gastro-esophageal reflux disease without esophagitis: Secondary | ICD-10-CM | POA: Insufficient documentation

## 2019-03-15 DIAGNOSIS — K449 Diaphragmatic hernia without obstruction or gangrene: Secondary | ICD-10-CM | POA: Insufficient documentation

## 2019-03-15 DIAGNOSIS — M47816 Spondylosis without myelopathy or radiculopathy, lumbar region: Secondary | ICD-10-CM | POA: Insufficient documentation

## 2019-03-15 DIAGNOSIS — Z9049 Acquired absence of other specified parts of digestive tract: Secondary | ICD-10-CM | POA: Insufficient documentation

## 2019-07-18 DIAGNOSIS — G5601 Carpal tunnel syndrome, right upper limb: Secondary | ICD-10-CM | POA: Insufficient documentation

## 2019-12-27 DIAGNOSIS — U071 COVID-19: Secondary | ICD-10-CM | POA: Insufficient documentation

## 2020-05-14 ENCOUNTER — Emergency Department (HOSPITAL_COMMUNITY)
Admission: EM | Admit: 2020-05-14 | Discharge: 2020-05-14 | Disposition: A | Discharge status: Home / Self Care | Payer: Self-pay | Attending: Emergency Medicine | Admitting: Emergency Medicine

## 2020-05-14 ENCOUNTER — Encounter (HOSPITAL_COMMUNITY): Payer: Self-pay

## 2020-05-14 ENCOUNTER — Other Ambulatory Visit: Payer: Self-pay

## 2020-05-14 DIAGNOSIS — H9319 Tinnitus, unspecified ear: Secondary | ICD-10-CM | POA: Insufficient documentation

## 2020-05-14 DIAGNOSIS — R0602 Shortness of breath: Secondary | ICD-10-CM | POA: Insufficient documentation

## 2020-05-14 DIAGNOSIS — R002 Palpitations: Secondary | ICD-10-CM | POA: Insufficient documentation

## 2020-05-14 DIAGNOSIS — R202 Paresthesia of skin: Secondary | ICD-10-CM | POA: Insufficient documentation

## 2020-05-14 DIAGNOSIS — Z87891 Personal history of nicotine dependence: Secondary | ICD-10-CM | POA: Insufficient documentation

## 2020-05-14 DIAGNOSIS — R6883 Chills (without fever): Secondary | ICD-10-CM | POA: Insufficient documentation

## 2020-05-14 DIAGNOSIS — Z7982 Long term (current) use of aspirin: Secondary | ICD-10-CM | POA: Insufficient documentation

## 2020-05-14 NOTE — ED Provider Notes (Signed)
[facility]-EMERGENCY DEPT Provider Note   CSN: [id number] Arrival date & time: 05/14/20  1523     History Chief Complaint  Patient presents with  . Chills    Gail Turner is a 60 y.o. female who presents to the ED today with complaint of "chills" that began earlier today. Pt reports she was at home when a feeling of "cold" wash over her abdomen causing her to feel like her heart was racing as well as feeling like she couldn't breathe with associated ringing in her ears and a tingling sensation all over her body. Pt states that it lasts a couple of seconds and then goes away. She reports that it has happened multiple times throughout the day and she wanted to come to the ED to make sure nothing was wrong. She denies any history of anxiety of panic attacks. She denies any increased stressors in her life - she does mention she has been dealing with R shoulder pain for sometime and is not getting a lot of sleep due to this and is unsure if that is related. Pt also mentions that her family members are dealing with a cold currently - they live with pt however they have all tested negative for COVID with an antigen and PCR test. She denies fevers, cough, chest pain, nausea, vomiting, diarrhea, urinary symptoms, or any other associated symptoms.   The history is provided by the patient and medical records.       Past Medical History:  Diagnosis Date  . Anemia   . Gallstones     Patient Active Problem List   Diagnosis Date Noted  . Biliary colic 05/20/2018  . Cholelithiasis with chronic cholecystitis 05/20/2018    Past Surgical History:  Procedure Laterality Date  . ABDOMINAL HYSTERECTOMY    . CARDIAC ELECTROPHYSIOLOGY STUDY AND ABLATION    . CESAREAN SECTION     x 2  . CHOLECYSTECTOMY N/A 05/20/2018   Procedure: LAPAROSCOPIC CHOLECYSTECTOMY WITH INTRAOPERATIVE CHOLANGIOGRAM;  Surgeon: Darnell Level, MD;  Location: [facility];  Service: General;  Laterality: N/A;      OB History   No obstetric history on file.     Family History  Problem Relation Age of Onset  . Breast cancer Mother   . Ovarian cancer Sister   . Brain cancer Maternal Aunt   . Colon cancer Neg Hx   . Esophageal cancer Neg Hx   . Stomach cancer Neg Hx   . Pancreatic cancer Neg Hx   . Liver disease Neg Hx     Social History   Tobacco Use  . Smoking status: Former Games developer  . Smokeless tobacco: Never Used  Vaping Use  . Vaping Use: Never used  Substance Use Topics  . Alcohol use: Yes    Comment: 4-5 drinks weekly  . Drug use: No    Home Medications Prior to Admission medications   Medication Sig Start Date End Date Taking? Authorizing Provider  acetaminophen (TYLENOL) 500 MG tablet You can take 1000 mg every 8 hours for pain.  This is your first-line pain medication.  You can use it for couple days and then see how you do without it.  You can buy this over-the-counter at any drugstore without a prescription.  Do not take more than 4000 mg of Tylenol(acetaminophen) it can harm your liver. Patient not taking: Reported on 07/05/2018 05/21/18   Sherrie George, PA-C  aspirin 325 MG tablet Take 325 mg by mouth daily as needed for mild  pain or headache.    [provider]  ibuprofen (ADVIL,MOTRIN) 200 MG tablet You can take 2 to 3 tablets every 6 hours as needed for pain.  You can start this 2 or 3 hours after you have taken your initial Tylenol.  This is your second medicine to use for pain control.  You can alternate this with the plain Tylenol. You can buy this over-the-counter without a prescription at any drugstore.  Do not exceed 3 tablets every 6 hours for pain. Patient not taking: Reported on 07/05/2018 05/21/18   Sherrie George, PA-C  omeprazole (PRILOSEC) 20 MG capsule Take 1 capsule (20 mg total) by mouth daily. 07/05/18   Robinson, [place] N, PA-C  ondansetron (ZOFRAN ODT) 4 MG disintegrating tablet Take 1 tablet (4 mg total) by mouth every 8 (eight) hours as  needed. Patient not taking: Reported on 07/05/2018 05/10/18   McDonald, Mia A, PA-C  OVER THE COUNTER MEDICATION Take 1 tablet by mouth as needed (acid reflux). OTC antiacid    [provider]  oxyCODONE (OXY IR/ROXICODONE) 5 MG immediate release tablet You can take 1 tablet every 6 hours for pain not relieved by plain Tylenol and ibuprofen.  We will not refill this prescription. Patient not taking: Reported on 07/05/2018 05/21/18   Sherrie George, PA-C    Allergies    Amoxicillin  Review of Systems   Review of Systems  Constitutional: Positive for chills. Negative for fever.  Respiratory: Positive for shortness of breath. Negative for cough.   Cardiovascular: Positive for palpitations. Negative for chest pain and leg swelling.  Gastrointestinal: Negative for nausea and vomiting.  Neurological: Negative for weakness and numbness.  All other systems reviewed and are negative.   Physical Exam Updated Vital Signs BP (!) 181/97 (BP Location: Left Arm)   Pulse 78   Temp 98.5 F (36.9 C) (Oral)   Resp 18   Ht 5' 3.5" (1.613 m)   Wt 90.7 kg   SpO2 99%   BMI 34.87 kg/m   Physical Exam Vitals and nursing note reviewed.  Constitutional:      Appearance: She is obese. She is not ill-appearing or diaphoretic.  HENT:     Head: Normocephalic and atraumatic.  Eyes:     Extraocular Movements: Extraocular movements intact.     Conjunctiva/sclera: Conjunctivae normal.     Pupils: Pupils are equal, round, and reactive to light.  Cardiovascular:     Rate and Rhythm: Normal rate and regular rhythm.     Pulses: Normal pulses.     Comments: 2+ radial pulses bilaterally Pulmonary:     Effort: Pulmonary effort is normal.     Breath sounds: Normal breath sounds. No wheezing, rhonchi or rales.  Abdominal:     Palpations: Abdomen is soft.     Tenderness: There is no abdominal tenderness. There is no guarding or rebound.  Musculoskeletal:     Cervical back: Neck supple. No rigidity.      Right lower leg: No edema.     Left lower leg: No edema.  Skin:    General: Skin is warm and dry.  Neurological:     Mental Status: She is alert.     Comments: Alert and oriented to self, place, time and event.   Speech is fluent, clear without dysarthria or dysphasia.   Strength 5/5 in upper/lower extremities  Sensation intact in upper/lower extremities   Normal gait.  Negative Romberg. No pronator drift.  Normal finger-to-nose and feet tapping.  CN  I not tested  CN II grossly intact visual fields bilaterally. Did not visualize posterior eye.   CN III, IV, VI PERRLA and EOMs intact bilaterally  CN V Intact sensation to sharp and light touch to the face  CN VII facial movements symmetric  CN VIII not tested  CN IX, X no uvula deviation, symmetric rise of soft palate  CN XI 5/5 SCM and trapezius strength bilaterally  CN XII Midline tongue protrusion, symmetric L/R movements      ED Results / Procedures / Treatments   Labs (all labs ordered are listed, but only abnormal results are displayed) Labs Reviewed - No data to display  EKG EKG Interpretation  Date/Time:  Thursday May 14 2020 16:20:14 EST Ventricular Rate:  68 PR Interval:    QRS Duration: 105 QT Interval:  415 QTC Calculation: 442 R Axis:   105 Text Interpretation: Sinus rhythm Consider right ventricular hypertrophy No acute changes No significant change since last tracing Confirmed by Derwood Kaplan [phone]) on 05/14/2020 4:28:41 PM   Radiology No results found.  Procedures Procedures   Medications Ordered in ED Medications - No data to display  ED Course  I have reviewed the triage vital signs and the nursing notes.  Pertinent labs & imaging results that were available during my care of the patient were reviewed by me and considered in my medical decision making (see chart for details).    MDM Rules/Calculators/A&P                          60 year old female presents to the ED today with  a complaints of chills, tingling sensation throughout her body, heart palpitations, shortness of breath that have been occurring intermittently throughout today.  She denies any active chest pain.  She is not having any symptoms currently.  Lasted about 30 minutes prior to arrival.  Vitals are stable on arrival, patient is afebrile, nontachycardic nontachypneic.  She appears to be in no acute distress.  She does mention family are recently getting over a cold that live with her however tested negative for Covid.  She denies any infectious type symptoms today including cough, abdominal pain, nausea, vomiting, diarrhea, urinary symptoms.  She is overall well-appearing.  She has no focal neuro deficits on exam today.  She has equal pulses throughout.  Her symptoms do sound more suspicious for anxiety at this time however she denies any history of anxiety or panic attacks.  An EKG was obtained given complaints of heart palpitations and shortness of breath, unchanged from previous.  She denies any family history of CAD, she smokes a couple puffs of a cigar daily.  Denies any chest pain.  Her symptoms are very atypical for ACS.  I have offered to test for Covid at this time given her close proximity to family members however she declines.  She states she had Covid in October this does not feel similar.  She is adamant that she does not want to wrap up a large bill at this time.  I do not feel lab work is necessary today.  We will plan to have patient follow-up with her PCP, will provide information for [facility] health and wellness.  Instructed to return to the ED for any worsening symptoms.  She is in agreement with plan and stable for discharge.   This note was prepared using Dragon voice recognition software and may include unintentional dictation errors due to the inherent limitations of voice recognition  software.  Final Clinical Impression(s) / ED Diagnoses Final diagnoses:  Chills    Rx / DC Orders ED  Discharge Orders    None       Discharge Instructions     Your EKG was unchanged from previous today.   I would recommend that you [facility] for primary care needs/for further evaluation of your symptoms today.   Please return to the ED IMMEDIATELY for any worsening symptoms including active chest pain, shortness of breath, one sided weakness/numbness, facial droop, speech changes, or any other new/concerning symptoms       Tanda RockersVenter, Margaux, PA-C 05/14/20 1635    Derwood KaplanNanavati, Ankit, MD 05/14/20 1932

## 2020-05-14 NOTE — ED Triage Notes (Signed)
Pt presents with c/o chills that started today. Pt reports that around 11 am this morning, she felt like something was rising up in her and she felt cold. No other complaints.

## 2020-05-14 NOTE — Discharge Instructions (Signed)
Your EKG was unchanged from previous today.   I would recommend that you follow up with [facility] for primary care needs/for further evaluation of your symptoms today.   Please return to the ED IMMEDIATELY for any worsening symptoms including active chest pain, shortness of breath, one sided weakness/numbness, facial droop, speech changes, or any other new/concerning symptoms

## 2020-09-28 ENCOUNTER — Other Ambulatory Visit: Payer: Self-pay

## 2020-09-28 ENCOUNTER — Emergency Department (HOSPITAL_COMMUNITY)
Admission: EM | Admit: 2020-09-28 | Discharge: 2020-09-28 | Disposition: A | Discharge status: Home / Self Care | Payer: 59 | Attending: Emergency Medicine | Admitting: Emergency Medicine

## 2020-09-28 ENCOUNTER — Encounter (HOSPITAL_COMMUNITY): Payer: Self-pay | Admitting: *Deleted

## 2020-09-28 DIAGNOSIS — Z87891 Personal history of nicotine dependence: Secondary | ICD-10-CM | POA: Diagnosis not present

## 2020-09-28 DIAGNOSIS — R109 Unspecified abdominal pain: Secondary | ICD-10-CM | POA: Diagnosis not present

## 2020-09-28 DIAGNOSIS — R197 Diarrhea, unspecified: Secondary | ICD-10-CM | POA: Diagnosis present

## 2020-09-28 DIAGNOSIS — A09 Infectious gastroenteritis and colitis, unspecified: Secondary | ICD-10-CM

## 2020-09-28 DIAGNOSIS — U071 COVID-19: Secondary | ICD-10-CM | POA: Insufficient documentation

## 2020-09-28 LAB — CBC WITH DIFFERENTIAL/PLATELET
Abs Immature Granulocytes: 0.02 10*3/uL (ref 0.00–0.07)
Basophils Absolute: 0.1 10*3/uL (ref 0.0–0.1)
Basophils Relative: 1 %
Eosinophils Absolute: 0.1 10*3/uL (ref 0.0–0.5)
Eosinophils Relative: 1 %
HCT: 47.8 % — ABNORMAL HIGH (ref 36.0–46.0)
Hemoglobin: 16.2 g/dL — ABNORMAL HIGH (ref 12.0–15.0)
Immature Granulocytes: 0 %
Lymphocytes Relative: 27 %
Lymphs Abs: 2.7 10*3/uL (ref 0.7–4.0)
MCH: 31.2 pg (ref 26.0–34.0)
MCHC: 33.9 g/dL (ref 30.0–36.0)
MCV: 91.9 fL (ref 80.0–100.0)
Monocytes Absolute: 0.4 10*3/uL (ref 0.1–1.0)
Monocytes Relative: 5 %
Neutro Abs: 6.5 10*3/uL (ref 1.7–7.7)
Neutrophils Relative %: 66 %
Platelets: 332 10*3/uL (ref 150–400)
RBC: 5.2 MIL/uL — ABNORMAL HIGH (ref 3.87–5.11)
RDW: 13 % (ref 11.5–15.5)
WBC: 9.8 10*3/uL (ref 4.0–10.5)
nRBC: 0 % (ref 0.0–0.2)

## 2020-09-28 LAB — BASIC METABOLIC PANEL
Anion gap: 10 (ref 5–15)
BUN: 13 mg/dL (ref 6–20)
CO2: 25 mmol/L (ref 22–32)
Calcium: 9.3 mg/dL (ref 8.9–10.3)
Chloride: 105 mmol/L (ref 98–111)
Creatinine, Ser: 0.8 mg/dL (ref 0.44–1.00)
GFR, Estimated: >60 mL/min (ref >60)
Glucose, Bld: 101 mg/dL — ABNORMAL HIGH (ref 70–99)
Potassium: 3.7 mmol/L (ref 3.5–5.1)
Sodium: 140 mmol/L (ref 135–145)

## 2020-09-28 MED ORDER — LOPERAMIDE HCL 2 MG PO CAPS: 2.0000 mg | ORAL_CAPSULE | Freq: Four times a day (QID) | ORAL | 0 refills | Status: DC | PRN

## 2020-09-28 MED ORDER — LACTATED RINGERS IV BOLUS
1000.0000 mL | Freq: Once | INTRAVENOUS | Status: AC
  Administered 2020-09-28: 1000 mL via INTRAVENOUS

## 2020-09-28 MED ORDER — LOPERAMIDE HCL 2 MG PO CAPS
2.0000 mg | ORAL_CAPSULE | Freq: Once | ORAL | Status: AC
  Administered 2020-09-28: 2 mg via ORAL
  Filled 2020-09-28: qty 1

## 2020-09-28 NOTE — ED Notes (Signed)
Patient offered fluids

## 2020-09-28 NOTE — ED Triage Notes (Signed)
Pt complains of diarrhea since this morning. Diagnosed with COVID 4 days ago. No vomiting. No pain or fever.

## 2020-09-28 NOTE — ED Provider Notes (Signed)
[facility]-EMERGENCY DEPT Provider Note   CSN: [id number] Arrival date & time: 09/28/20  1011     History Chief Complaint  Patient presents with   Diarrhea    Covid +    Gail Turner is a 60 y.o. female.  HPI     60 year old comes in a chief complaint of diarrhea.  Patient reports that she just returned from a vacation in [place] and was diagnosed with COVID-19.'s her diagnosis came 4 days ago.  She is having some cough and URI-like symptoms, but more recently she started having abdominal pain and diarrhea.  She has had at least 4 episodes of BM in the last 24 hours.  Patient denies any nausea, vomiting.  She is having some dizziness, weakness and felt dehydrated, prompting her to come to the ER.  Patient denies any bloody stools.  She reports that she is able to keep the fluid down, but instantly has diarrhea after p.o. intake.Marland Kitchen  Past Medical History:  Diagnosis Date   Anemia    Gallstones     Patient Active Problem List   Diagnosis Date Noted   Biliary colic 05/20/2018   Cholelithiasis with chronic cholecystitis 05/20/2018    Past Surgical History:  Procedure Laterality Date   ABDOMINAL HYSTERECTOMY     CARDIAC ELECTROPHYSIOLOGY STUDY AND ABLATION     CESAREAN SECTION     x 2   CHOLECYSTECTOMY N/A 05/20/2018   Procedure: LAPAROSCOPIC CHOLECYSTECTOMY WITH INTRAOPERATIVE CHOLANGIOGRAM;  Surgeon: Darnell Level, MD;  Location: [facility];  Service: General;  Laterality: N/A;     OB History   No obstetric history on file.     Family History  Problem Relation Age of Onset   Breast cancer Mother    Ovarian cancer Sister    Brain cancer Maternal Aunt    Colon cancer Neg Hx    Esophageal cancer Neg Hx    Stomach cancer Neg Hx    Pancreatic cancer Neg Hx    Liver disease Neg Hx     Social History   Tobacco Use   Smoking status: Former   Smokeless tobacco: Never  Building services engineer Use: Never used  Substance Use Topics   Alcohol use: Yes     Comment: 4-5 drinks weekly   Drug use: No    Home Medications Prior to Admission medications   Medication Sig Start Date End Date Taking? Authorizing Provider  loperamide (IMODIUM) 2 MG capsule Take 1 capsule (2 mg total) by mouth 4 (four) times daily as needed for diarrhea or loose stools. 09/28/20  Yes Derwood Kaplan, MD  acetaminophen (TYLENOL) 500 MG tablet You can take 1000 mg every 8 hours for pain.  This is your first-line pain medication.  You can use it for couple days and then see how you do without it.  You can buy this over-the-counter at any drugstore without a prescription.  Do not take more than 4000 mg of Tylenol(acetaminophen) it can harm your liver. Patient not taking: Reported on 07/05/2018 05/21/18   Sherrie George, PA-C  aspirin 325 MG tablet Take 325 mg by mouth daily as needed for mild pain or headache.    [provider]  ibuprofen (ADVIL,MOTRIN) 200 MG tablet You can take 2 to 3 tablets every 6 hours as needed for pain.  You can start this 2 or 3 hours after you have taken your initial Tylenol.  This is your second medicine to use for pain control.  You can  alternate this with the plain Tylenol. You can buy this over-the-counter without a prescription at any drugstore.  Do not exceed 3 tablets every 6 hours for pain. Patient not taking: Reported on 07/05/2018 05/21/18   Sherrie George, PA-C  omeprazole (PRILOSEC) 20 MG capsule Take 1 capsule (20 mg total) by mouth daily. 07/05/18   Robinson, [place] N, PA-C  ondansetron (ZOFRAN ODT) 4 MG disintegrating tablet Take 1 tablet (4 mg total) by mouth every 8 (eight) hours as needed. Patient not taking: Reported on 07/05/2018 05/10/18   McDonald, Mia A, PA-C  OVER THE COUNTER MEDICATION Take 1 tablet by mouth as needed (acid reflux). OTC antiacid    [provider]  oxyCODONE (OXY IR/ROXICODONE) 5 MG immediate release tablet You can take 1 tablet every 6 hours for pain not relieved by plain Tylenol and  ibuprofen.  We will not refill this prescription. Patient not taking: Reported on 07/05/2018 05/21/18   Sherrie George, PA-C    Allergies    Amoxicillin  Review of Systems   Review of Systems  Constitutional:  Positive for activity change. Negative for fever.  Gastrointestinal:  Positive for diarrhea.  Genitourinary:  Negative for dysuria.  Neurological:  Positive for dizziness.  All other systems reviewed and are negative.  Physical Exam Updated Vital Signs BP (!) 154/95   Pulse 85   Temp 98.2 F (36.8 C) (Oral)   Resp 16   Ht 5\' 3"  (1.6 m)   Wt 81.6 kg   SpO2 96%   BMI 31.89 kg/m   Physical Exam Vitals and nursing note reviewed.  Constitutional:      Appearance: She is well-developed.  HENT:     Head: Atraumatic.  Cardiovascular:     Rate and Rhythm: Normal rate.  Pulmonary:     Effort: Pulmonary effort is normal.  Musculoskeletal:     Cervical back: Normal range of motion and neck supple.  Skin:    General: Skin is warm and dry.  Neurological:     Mental Status: She is alert and oriented to person, place, and time.    ED Results / Procedures / Treatments   Labs (all labs ordered are listed, but only abnormal results are displayed) Labs Reviewed  BASIC METABOLIC PANEL - Abnormal; Notable for the following components:      Result Value   Glucose, Bld 101 (*)    All other components within normal limits  CBC WITH DIFFERENTIAL/PLATELET - Abnormal; Notable for the following components:   RBC 5.20 (*)    Hemoglobin 16.2 (*)    HCT 47.8 (*)    All other components within normal limits    EKG None  Radiology No results found.  Procedures Procedures   Medications Ordered in ED Medications  lactated ringers bolus 1,000 mL (1,000 mLs Intravenous New Bag/Given 09/28/20 1311)  loperamide (IMODIUM) capsule 2 mg (2 mg Oral Given 09/28/20 1310)    ED Course  I have reviewed the triage vital signs and the nursing notes.  Pertinent labs & imaging results  that were available during my care of the patient were reviewed by me and considered in my medical decision making (see chart for details).    MDM Rules/Calculators/A&P                           Gail Turner was evaluated in Emergency Department on 09/28/2020 for the symptoms described in the history of present illness. She was evaluated in  the context of the global COVID-19 pandemic, which necessitated consideration that the patient might be at risk for infection with the SARS-CoV-2 virus that causes COVID-19. Institutional protocols and algorithms that pertain to the evaluation of patients at risk for COVID-19 are in a state of rapid change based on information released by regulatory bodies including the CDC and federal and state organizations. These policies and algorithms were followed during the patient's care in the ED.  60 year old female comes in with chief complaint of diarrhea.  She was diagnosed with COVID-19 4 or 5 days ago, after she returned from [place].  She is vaccinated but not boosted.  She started developing GI symptoms more recently, after the respiratory symptoms.  Now having some dizziness, malaise with activity.  She is noted to be tachycardic.  We will hydrate, check basic labs to make sure there is no significant electrolyte abnormalities.  Anticipate discharge, p.o. challenge initiated and passed.  Of note, although patient had a recent travel history to [place], her symptoms do not appear to be traveler's diarrhea.  She has no cramping abdominal pain and fevers, bloody stools.  I suspect that her current symptoms are more related to COVID-19 and traveler's diarrhea.  Strict ER return precautions for bloody stools have been discussed though.  Final Clinical Impression(s) / ED Diagnoses Final diagnoses:  Diarrhea of infectious origin  COVID-19    Rx / DC Orders ED Discharge Orders          Ordered    loperamide (IMODIUM) 2 MG capsule  4 times daily PRN         09/28/20 1359             Derwood Kaplan, MD 09/28/20 1400

## 2020-09-28 NOTE — Discharge Instructions (Addendum)
You are given 1 L of fluid while in the ER for your dehydration.  Take the diarrhea medication as prescribed. Hydrate well.  Return to the ER if your symptoms get worse (bloody stools, inability to keep anything down, severe dizziness or near fainting).

## 2020-10-28 DIAGNOSIS — R1084 Generalized abdominal pain: Secondary | ICD-10-CM

## 2020-10-28 HISTORY — DX: Generalized abdominal pain: R10.84

## 2020-10-29 DIAGNOSIS — R7303 Prediabetes: Secondary | ICD-10-CM | POA: Insufficient documentation

## 2020-12-12 IMAGING — US ULTRASOUND ABDOMEN LIMITED
1 series · 14 of 25 positions shown · non-contrast
Comparison: Right upper quadrant ultrasound dated May 20, 2018.

CLINICAL DATA: Epigastric abdominal pain.

EXAM:
ULTRASOUND ABDOMEN LIMITED RIGHT UPPER QUADRANT

[Series 1: ultrasound abdomen limited · 14 of 66 slices shown]
[im 1/66]
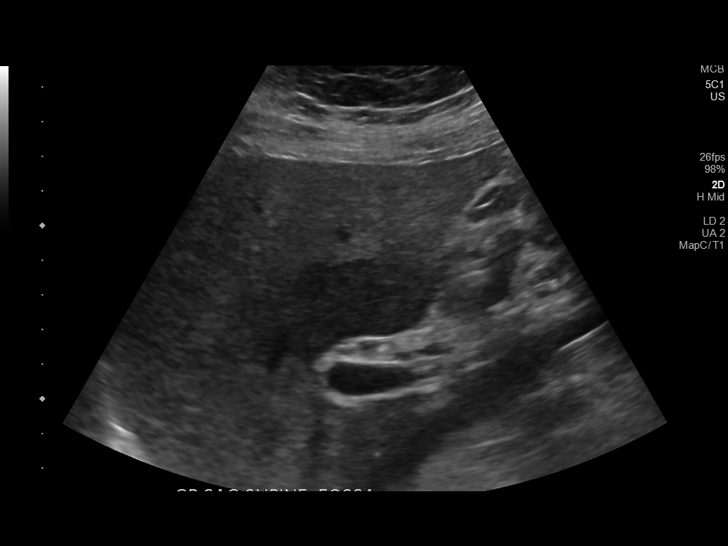
[im 6/66]
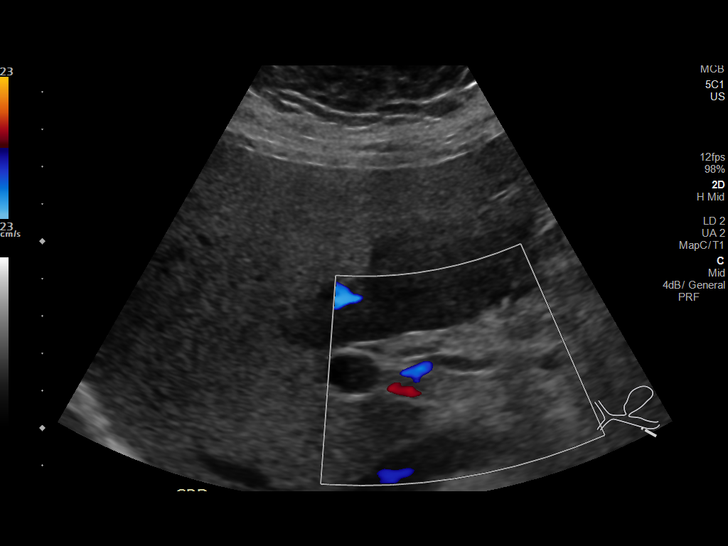
[im 11/66]
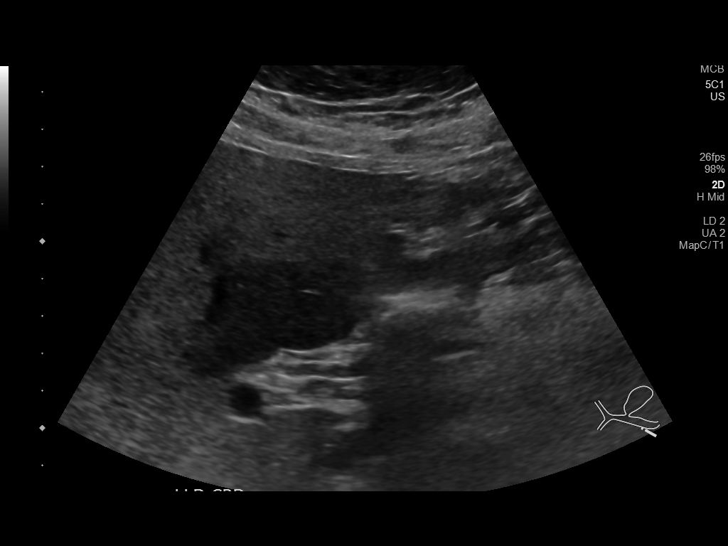
[im 17/66]
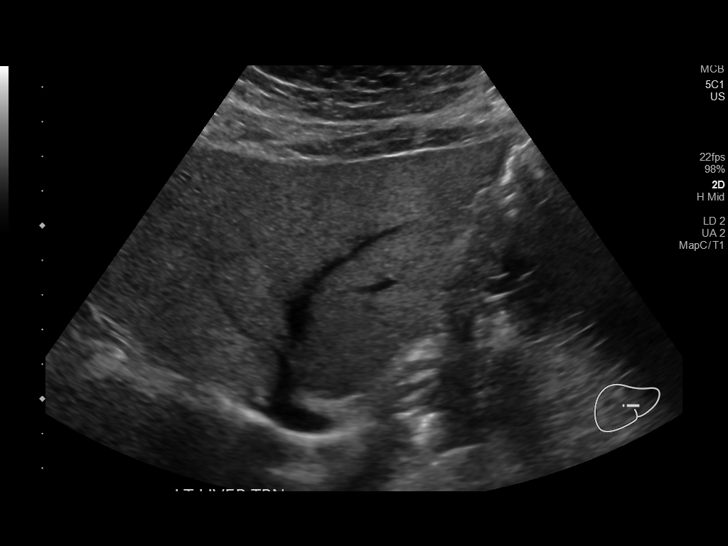
[im 22/66]
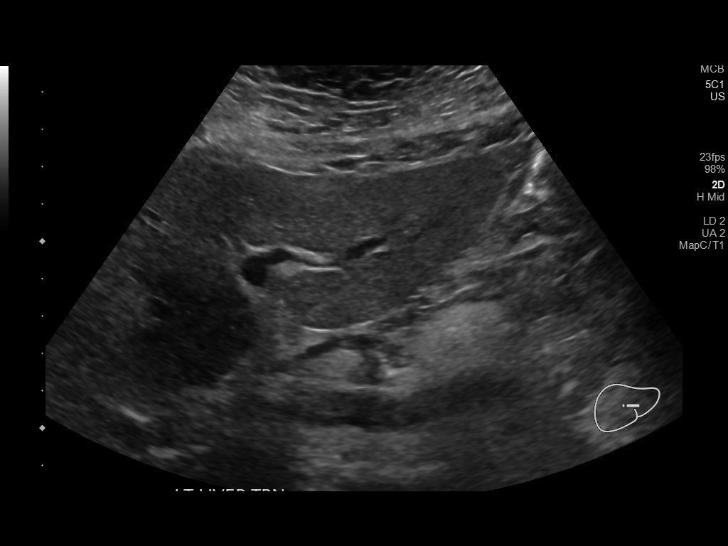
[im 25/66]
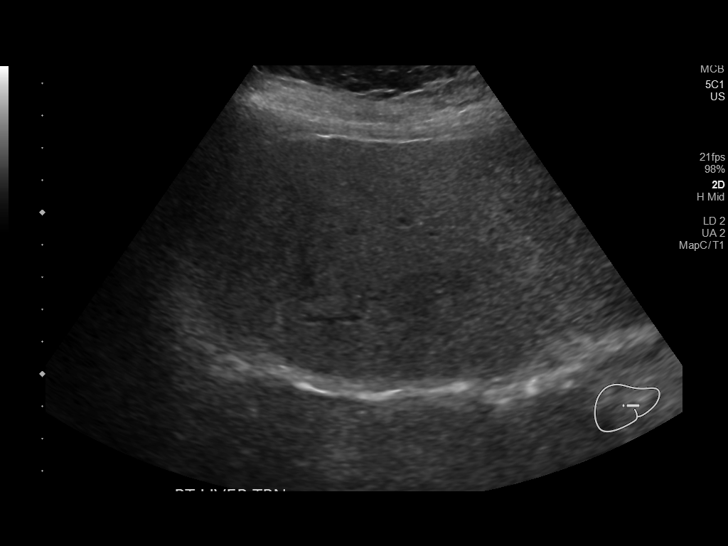
[im 30/66]
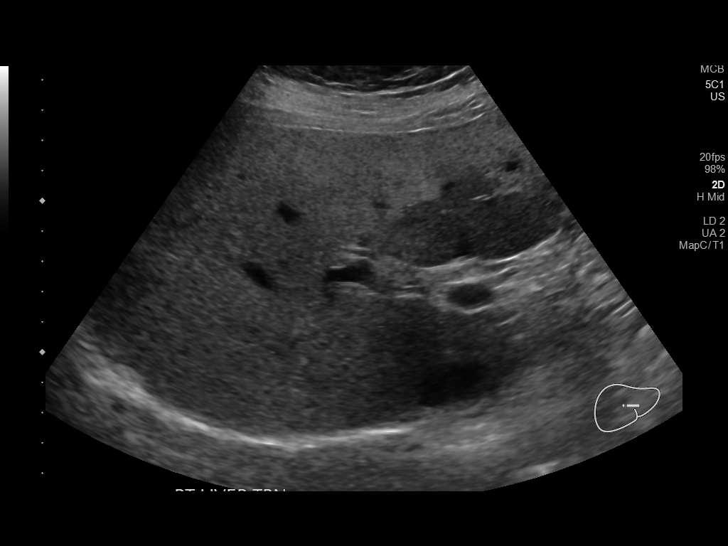
[im 36/66]
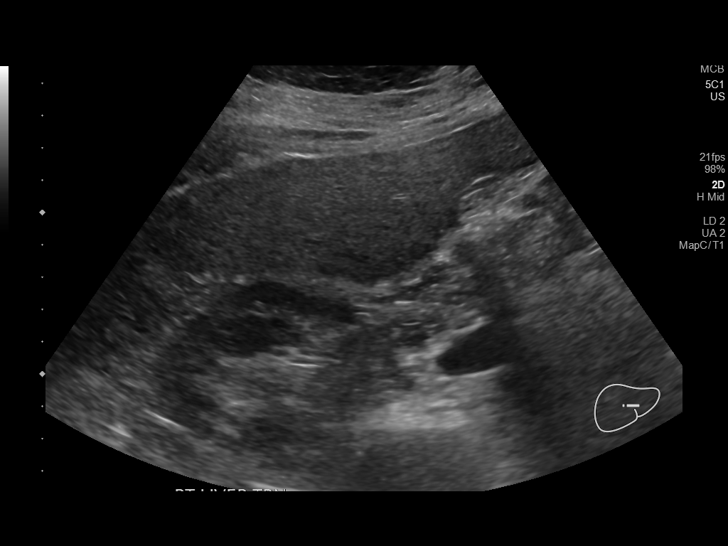
[im 41/66]
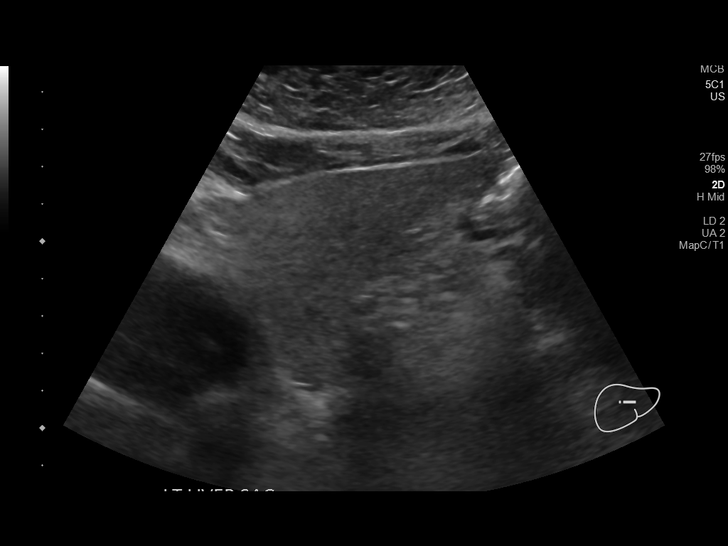
[im 44/66]
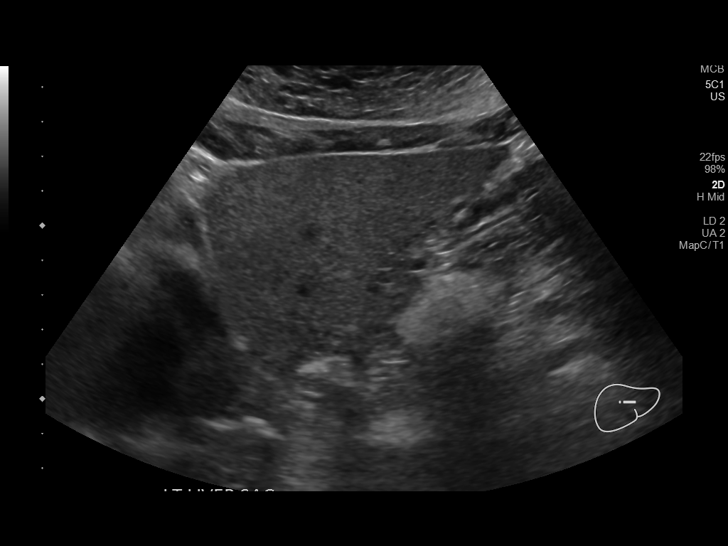
[im 49/66]
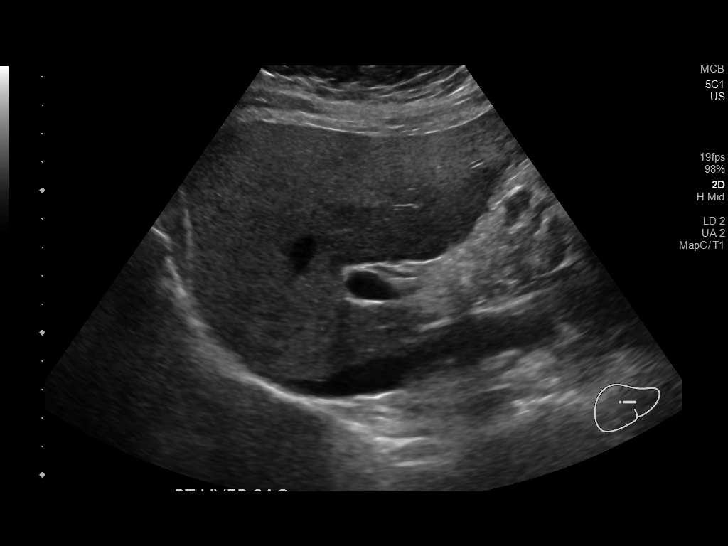
[im 55/66]
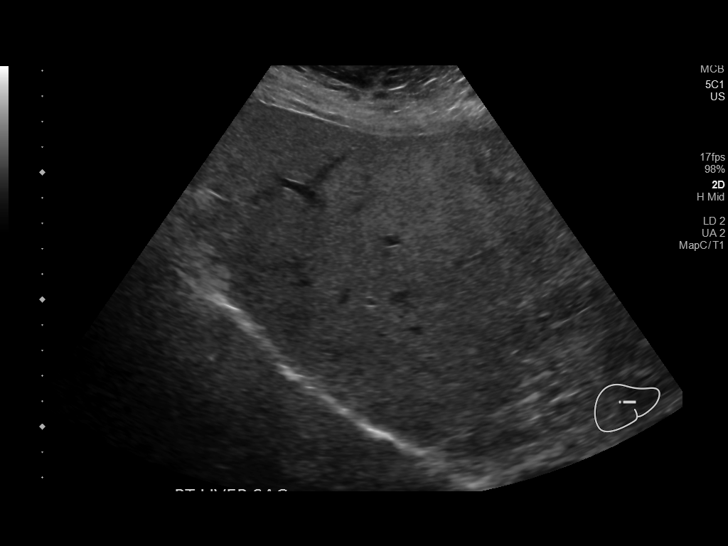
[im 60/66]
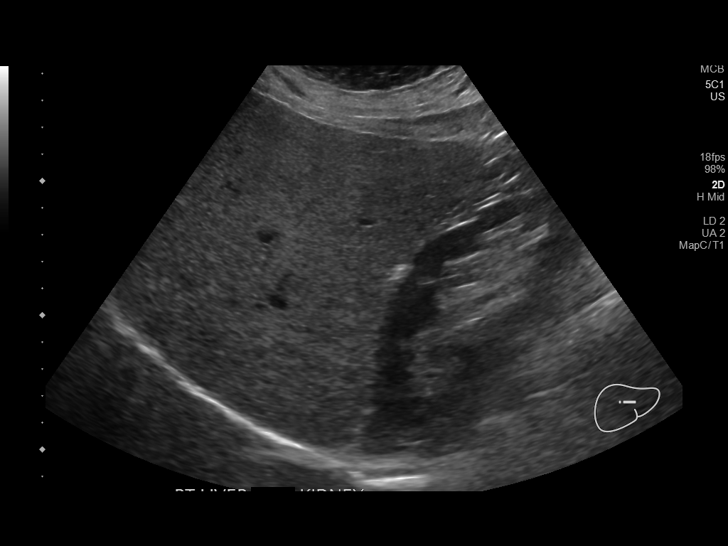
[im 66/66]
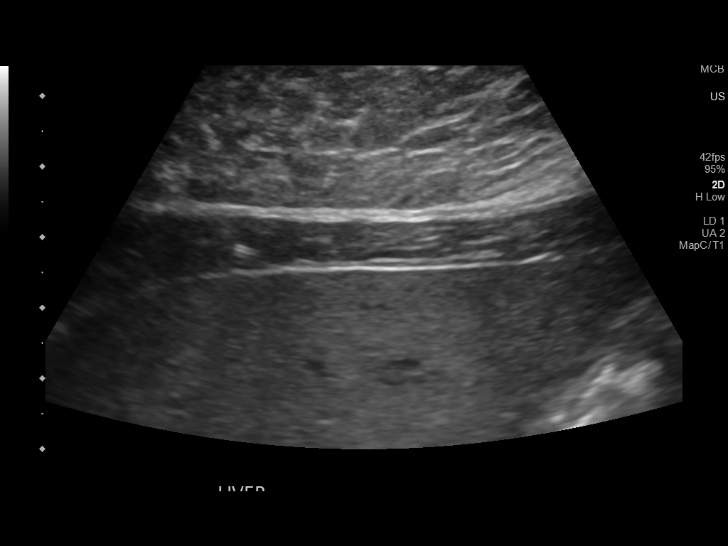

[14 of 25 positions shown; findings below may reference images not displayed]

FINDINGS: Gallbladder:

Surgically absent.

Common bile duct:

Diameter: 6 mm, normal.

Liver:

No focal lesion identified. Increased in parenchymal echogenicity.
Portal vein is patent on color Doppler imaging with normal direction
of blood flow towards the liver.
IMPRESSION: 1. No acute abnormality.
2. Hepatic steatosis.

## 2021-01-07 DIAGNOSIS — G8929 Other chronic pain: Secondary | ICD-10-CM | POA: Insufficient documentation

## 2021-01-27 ENCOUNTER — Emergency Department (HOSPITAL_COMMUNITY): Payer: 59

## 2021-01-27 ENCOUNTER — Other Ambulatory Visit: Payer: Self-pay

## 2021-01-27 ENCOUNTER — Encounter (HOSPITAL_COMMUNITY): Payer: Self-pay

## 2021-01-27 ENCOUNTER — Emergency Department (HOSPITAL_COMMUNITY)
Admission: EM | Admit: 2021-01-27 | Discharge: 2021-01-28 | Disposition: A | Discharge status: Home / Self Care | Payer: 59 | Attending: Emergency Medicine | Admitting: Emergency Medicine

## 2021-01-27 DIAGNOSIS — Z20822 Contact with and (suspected) exposure to covid-19: Secondary | ICD-10-CM | POA: Diagnosis not present

## 2021-01-27 DIAGNOSIS — R42 Dizziness and giddiness: Secondary | ICD-10-CM | POA: Diagnosis not present

## 2021-01-27 DIAGNOSIS — R109 Unspecified abdominal pain: Secondary | ICD-10-CM | POA: Diagnosis not present

## 2021-01-27 DIAGNOSIS — Z87891 Personal history of nicotine dependence: Secondary | ICD-10-CM | POA: Diagnosis not present

## 2021-01-27 DIAGNOSIS — N9489 Other specified conditions associated with female genital organs and menstrual cycle: Secondary | ICD-10-CM | POA: Diagnosis not present

## 2021-01-27 DIAGNOSIS — K449 Diaphragmatic hernia without obstruction or gangrene: Secondary | ICD-10-CM | POA: Insufficient documentation

## 2021-01-27 DIAGNOSIS — R6883 Chills (without fever): Secondary | ICD-10-CM | POA: Insufficient documentation

## 2021-01-27 DIAGNOSIS — R110 Nausea: Secondary | ICD-10-CM | POA: Insufficient documentation

## 2021-01-27 DIAGNOSIS — I10 Essential (primary) hypertension: Secondary | ICD-10-CM | POA: Diagnosis not present

## 2021-01-27 DIAGNOSIS — R0789 Other chest pain: Secondary | ICD-10-CM | POA: Diagnosis present

## 2021-01-27 DIAGNOSIS — R0602 Shortness of breath: Secondary | ICD-10-CM | POA: Diagnosis not present

## 2021-01-27 DIAGNOSIS — Z79899 Other long term (current) drug therapy: Secondary | ICD-10-CM | POA: Diagnosis not present

## 2021-01-27 DIAGNOSIS — R251 Tremor, unspecified: Secondary | ICD-10-CM | POA: Diagnosis not present

## 2021-01-27 LAB — I-STAT CHEM 8, ED
BUN: 15 mg/dL (ref 6–20)
Calcium, Ion: 1.19 mmol/L (ref 1.15–1.40)
Chloride: 103 mmol/L (ref 98–111)
Creatinine, Ser: 0.9 mg/dL (ref 0.44–1.00)
Glucose, Bld: 139 mg/dL — ABNORMAL HIGH (ref 70–99)
HCT: 45 % (ref 36.0–46.0)
Hemoglobin: 15.3 g/dL — ABNORMAL HIGH (ref 12.0–15.0)
Potassium: 3.4 mmol/L — ABNORMAL LOW (ref 3.5–5.1)
Sodium: 141 mmol/L (ref 135–145)
TCO2: 27 mmol/L (ref 22–32)

## 2021-01-27 LAB — BASIC METABOLIC PANEL
Anion gap: 7 (ref 5–15)
BUN: 15 mg/dL (ref 6–20)
CO2: 27 mmol/L (ref 22–32)
Calcium: 9.1 mg/dL (ref 8.9–10.3)
Chloride: 104 mmol/L (ref 98–111)
Creatinine, Ser: 0.88 mg/dL (ref 0.44–1.00)
GFR, Estimated: >60 mL/min (ref >60)
Glucose, Bld: 142 mg/dL — ABNORMAL HIGH (ref 70–99)
Potassium: 3.4 mmol/L — ABNORMAL LOW (ref 3.5–5.1)
Sodium: 138 mmol/L (ref 135–145)

## 2021-01-27 LAB — CBC
HCT: 42.9 % (ref 36.0–46.0)
Hemoglobin: 14.5 g/dL (ref 12.0–15.0)
MCH: 31 pg (ref 26.0–34.0)
MCHC: 33.8 g/dL (ref 30.0–36.0)
MCV: 91.7 fL (ref 80.0–100.0)
Platelets: 298 10*3/uL (ref 150–400)
RBC: 4.68 MIL/uL (ref 3.87–5.11)
RDW: 12.8 % (ref 11.5–15.5)
WBC: 10.1 10*3/uL (ref 4.0–10.5)
nRBC: 0 % (ref 0.0–0.2)

## 2021-01-27 LAB — I-STAT BETA HCG BLOOD, ED (MC, WL, AP ONLY): I-stat hCG, quantitative: <5 m[IU]/mL (ref <5)

## 2021-01-27 LAB — RESP PANEL BY RT-PCR (FLU A&B, COVID) ARPGX2
Influenza A by PCR: NEGATIVE
Influenza B by PCR: NEGATIVE
SARS Coronavirus 2 by RT PCR: NEGATIVE

## 2021-01-27 LAB — TROPONIN I (HIGH SENSITIVITY): Troponin I (High Sensitivity): 3 ng/L (ref <18)

## 2021-01-27 MED ORDER — HYDRALAZINE HCL 20 MG/ML IJ SOLN
10.0000 mg | Freq: Once | INTRAMUSCULAR | Status: AC
  Administered 2021-01-27: 10 mg via INTRAVENOUS
  Filled 2021-01-27: qty 1

## 2021-01-27 MED ORDER — IOHEXOL 350 MG/ML SOLN
80.0000 mL | Freq: Once | INTRAVENOUS | Status: AC | PRN
  Administered 2021-01-27: 100 mL via INTRAVENOUS

## 2021-01-27 NOTE — ED Triage Notes (Signed)
Pt c/o intermittent chest pain with SOB with exertion. Pt c/o intermittent abdominal pain. Pt stated symptoms started at approx 2045 this evening. Pt has hx of HTN. Pt states she has not been taking her BP meds.

## 2021-01-27 NOTE — ED Provider Notes (Signed)
[facility] DEPT Provider Note   CSN: [id number] Arrival date & time: 01/27/21  2135     History Chief Complaint  Patient presents with   Chest Pain   Shortness of Breath   Abdominal Pain    Gail Turner is a 60 y.o. female who presents to the ED today with complaint of gradual onset, constant, waxing and waning, mild, substernal chest pain/pressure that began around 8:45 PM today.  Patient states that she was sitting on the couch when this occurred.  She also complains of feeling lightheaded and having some shortness of breath.  She denies any pleuritic type chest pain.  She states that it radiates into her bilateral shoulders.  Denies radiation to the jaws, arms, through to the back.  She also complains of intermittent "shaking" in her arms and feels like she is having chills.  She denies fevers.  She does report she had a tightness sensation in her throat since pending the holiday with her significant other.  She states this does not feel like typical acid reflux.  She denies any pain with swallowing.  No cough or body aches.  During triage she mentioned she was having some abdominal pain.  She reports history of cholecystectomy and reports intermittent abdominal pain however no worse than normal.  She does report she felt nauseated with her chest pain however denies any vomiting or diaphoresis.   She does mention that she has not been taking her blood pressure medication for approximately 10 days after being placed on pain medication for cervical spondylosis.  She states she did not like how they were interacting with age other decided not to take her blood pressure medicine.  Blood pressure 204/109 today.  She denies any history of CAD or family history.  She denies any history of DVT/PE.  No recent prolonged travel or immobilization.  No hemoptysis.  No active malignancy.  No exogenous hormone use.   The history is provided by the patient and medical  records.      Past Medical History:  Diagnosis Date   Anemia    Gallstones     Patient Active Problem List   Diagnosis Date Noted   Biliary colic [id number]   Cholelithiasis with chronic cholecystitis 05/20/2018    Past Surgical History:  Procedure Laterality Date   ABDOMINAL HYSTERECTOMY     CARDIAC ELECTROPHYSIOLOGY STUDY AND ABLATION     CESAREAN SECTION     x 2   CHOLECYSTECTOMY N/A 05/20/2018   Procedure: LAPAROSCOPIC CHOLECYSTECTOMY WITH INTRAOPERATIVE CHOLANGIOGRAM;  Surgeon: Armandina Gemma, MD;  Location: [facility];  Service: General;  Laterality: N/A;     OB History   No obstetric history on file.     Family History  Problem Relation Age of Onset   Breast cancer Mother    Ovarian cancer Sister    Brain cancer Maternal Aunt    Colon cancer Neg Hx    Esophageal cancer Neg Hx    Stomach cancer Neg Hx    Pancreatic cancer Neg Hx    Liver disease Neg Hx     Social History   Tobacco Use   Smoking status: Former   Smokeless tobacco: Never  Scientific laboratory technician Use: Never used  Substance Use Topics   Alcohol use: Yes    Comment: 4-5 drinks weekly   Drug use: No    Home Medications Prior to Admission medications   Medication Sig Start Date End Date Taking? Authorizing Provider  acetaminophen (TYLENOL) 500 MG tablet You can take 1000 mg every 8 hours for pain.  This is your first-line pain medication.  You can use it for couple days and then see how you do without it.  You can buy this over-the-counter at any drugstore without a prescription.  Do not take more than 4000 mg of Tylenol(acetaminophen) it can harm your liver. 05/21/18  Yes Earnstine Regal, PA-C  COLLAGEN PO Take 3 capsules by mouth at bedtime.   Yes [provider]  gabapentin (NEURONTIN) 300 MG capsule Take 300 mg by mouth daily. for 10 days 01/14/21  Yes [provider]  ibuprofen (ADVIL,MOTRIN) 200 MG tablet You can take 2 to 3 tablets every 6 hours as needed for pain.  You can  start this 2 or 3 hours after you have taken your initial Tylenol.  This is your second medicine to use for pain control.  You can alternate this with the plain Tylenol. You can buy this over-the-counter without a prescription at any drugstore.  Do not exceed 3 tablets every 6 hours for pain. 05/21/18  Yes Earnstine Regal, PA-C  pantoprazole (PROTONIX) 40 MG tablet Take 40 mg by mouth daily. 01/09/21  Yes [provider]  amLODipine (NORVASC) 5 MG tablet Take 5 mg by mouth daily. Patient not taking: Reported on 01/28/2021 10/28/20   [provider]  loperamide (IMODIUM) 2 MG capsule Take 1 capsule (2 mg total) by mouth 4 (four) times daily as needed for diarrhea or loose stools. Patient not taking: Reported on 01/28/2021 09/28/20   Varney Biles, MD    Allergies    Losartan and Amoxicillin  Review of Systems   Review of Systems  Constitutional:  Positive for chills. Negative for fatigue and fever.  HENT:  Negative for sore throat.   Respiratory:  Negative for cough and shortness of breath.   Cardiovascular:  Positive for chest pain.  Gastrointestinal:  Positive for nausea. Negative for vomiting.  Neurological:  Positive for light-headedness.  All other systems reviewed and are negative.  Physical Exam Updated Vital Signs BP (!) 160/92   Pulse 92   Temp 98 F (36.7 C) (Oral)   Resp 12   SpO2 98%   Physical Exam Vitals and nursing note reviewed.  Constitutional:      Appearance: She is not ill-appearing or diaphoretic.  HENT:     Head: Normocephalic and atraumatic.  Eyes:     Conjunctiva/sclera: Conjunctivae normal.  Cardiovascular:     Rate and Rhythm: Normal rate and regular rhythm.     Pulses:          Radial pulses are 2+ on the right side and 2+ on the left side.       Posterior tibial pulses are 2+ on the right side and 2+ on the left side.     Heart sounds: Normal heart sounds.  Pulmonary:     Effort: Pulmonary effort is normal.     Breath sounds:  Normal breath sounds. No decreased breath sounds, wheezing, rhonchi or rales.  Abdominal:     Palpations: Abdomen is soft.     Tenderness: There is no abdominal tenderness. There is no guarding or rebound.  Musculoskeletal:     Cervical back: Neck supple.     Right lower leg: No edema.     Left lower leg: No edema.  Skin:    General: Skin is warm and dry.  Neurological:     Mental Status: She is alert.  ED Results / Procedures / Treatments   Labs (all labs ordered are listed, but only abnormal results are displayed) Labs Reviewed  BASIC METABOLIC PANEL - Abnormal; Notable for the following components:      Result Value   Potassium 3.4 (*)    Glucose, Bld 142 (*)    All other components within normal limits  I-STAT CHEM 8, ED - Abnormal; Notable for the following components:   Potassium 3.4 (*)    Glucose, Bld 139 (*)    Hemoglobin 15.3 (*)    All other components within normal limits  RESP PANEL BY RT-PCR (FLU A&B, COVID) ARPGX2  CBC  I-STAT BETA HCG BLOOD, ED ([facility], [facility], [facility] ONLY)  TROPONIN I (HIGH SENSITIVITY)  TROPONIN I (HIGH SENSITIVITY)    EKG EKG Interpretation  Date/Time:  Wednesday January 27 2021 22:24:53 EST Ventricular Rate:  87 PR Interval:  179 QRS Duration: 110 QT Interval:  401 QTC Calculation: 483 R Axis:   104 Text Interpretation: Sinus rhythm Low voltage, precordial leads Consider right ventricular hypertrophy similar to Mar 2022 Confirmed by Sherwood Gambler [phone]) on 01/27/2021 10:51:14 PM  Radiology DG Chest 2 View  Result Date: 01/27/2021 CLINICAL DATA:  Chest pain and hypertension, initial encounter EXAM: CHEST - 2 VIEW COMPARISON:  05/10/2018 FINDINGS: Cardiac shadow is stable. The lungs are clear bilaterally. No focal infiltrate or effusion is seen. No bony abnormality is noted. IMPRESSION: No acute abnormality noted. Electronically Signed   By: Inez Catalina M.D.   On: 01/27/2021 22:01   CT Angio Chest/Abd/Pel for Dissection W and/or  W/WO  Result Date: 01/27/2021 CLINICAL DATA:  Thoracic aortic aneurysm (TAA) suspected. intermittent chest pain with SOB with exertion. Pt c/o intermittent abdominal pain. Pt stated symptoms started at approx 2045 this evening. EXAM: CT ANGIOGRAPHY CHEST, ABDOMEN AND PELVIS TECHNIQUE: Non-contrast CT of the chest was initially obtained. Multidetector CT imaging through the chest, abdomen and pelvis was performed using the standard protocol during bolus administration of intravenous contrast. Multiplanar reconstructed images and MIPs were obtained and reviewed to evaluate the vascular anatomy. CONTRAST:  1106mL OMNIPAQUE IOHEXOL 350 MG/ML SOLN COMPARISON:  None. FINDINGS: CTA CHEST FINDINGS Cardiovascular: Preferential opacification of the thoracic aorta. No evidence of thoracic aortic aneurysm or dissection. No atherosclerotic plaque. No coronary calcification. Normal heart size. No significant pericardial effusion. The main pulmonary artery is normal in caliber. No central or proximal segmental pulmonary embolus. Mediastinum/Nodes: No enlarged mediastinal, hilar, or axillary lymph nodes. Thyroid gland, trachea, and esophagus demonstrate no significant findings. Small hiatal hernia. Lungs/Pleura: No focal consolidation. No pulmonary nodule. No pulmonary mass. No pleural effusion. No pneumothorax. Musculoskeletal: No chest wall abnormality. No suspicious lytic or blastic osseous lesions. No acute displaced fracture. Multilevel mild degenerative changes of the spine. Review of the MIP images confirms the above findings. CTA ABDOMEN AND PELVIS FINDINGS VASCULAR Aorta: Normal caliber aorta without aneurysm, dissection, vasculitis or significant stenosis. Celiac: Patent without evidence of aneurysm, dissection, vasculitis or significant stenosis. SMA: Patent without evidence of aneurysm, dissection, vasculitis or significant stenosis. Renals: Both renal arteries are patent without evidence of aneurysm, dissection,  vasculitis, fibromuscular dysplasia or significant stenosis. IMA: Patent without evidence of aneurysm, dissection, vasculitis or significant stenosis. Inflow: Patent without evidence of aneurysm, dissection, vasculitis or significant stenosis. Veins: The main portal, splenic, superior mesenteric veins are patent. Review of the MIP images confirms the above findings. NON-VASCULAR Hepatobiliary: No focal liver abnormality. Status post cholecystectomy. No biliary dilatation. Pancreas: No focal lesion. Normal pancreatic contour.  No surrounding inflammatory changes. No main pancreatic ductal dilatation. Spleen: Normal in size without focal abnormality. Adrenals/Urinary Tract: No adrenal nodule bilaterally. Bilateral kidneys enhance symmetrically. No hydronephrosis. No hydroureter. The urinary bladder is unremarkable. Stomach/Bowel: Stomach is within normal limits. No evidence of bowel wall thickening or dilatation. Appendix appears normal. Lymphatic: No lymphadenopathy. Reproductive: Status post hysterectomy. No adnexal masses. Other: CT body other Musculoskeletal: No abdominal wall hernia or abnormality. No suspicious lytic or blastic osseous lesions. No acute displaced fracture. Grade 1 anterolisthesis of L4 on L5 and L5 on S1. Review of the MIP images confirms the above findings. IMPRESSION: 1. No acute vascular abnormality. 2. Small hiatal hernia. 3. Otherwise no acute intrathoracic, intra-abdominal, intrapelvic abnormality. Electronically Signed   By: Iven Finn M.D.   On: 01/27/2021 23:29    Procedures Procedures   Medications Ordered in ED Medications  hydrALAZINE (APRESOLINE) injection 10 mg (10 mg Intravenous Given 01/27/21 2232)  iohexol (OMNIPAQUE) 350 MG/ML injection 80 mL (100 mLs Intravenous Contrast Given 01/27/21 2308)    ED Course  I have reviewed the triage vital signs and the nursing notes.  Pertinent labs & imaging results that were available during my care of the patient were  reviewed by me and considered in my medical decision making (see chart for details).  Clinical Course as of 01/28/21 0249  Wed Jan 27, 2021  2344 Repeat BP 161/91 [MV]    Clinical Course User Index [MV] Eustaquio Maize, [place]   MDM Rules/Calculators/A&P                           60 year old female presents to the ED today with complaint of substernal chest pain with complaints of lightheadedness and chills.  Noted to be significantly hypertensive while in triage at 204/109 however has been noncompliant with her amlodipine 5 mg for the past 10 days since starting pain medications secondary to cervical spondylosis.  He was initially tachycardic at 105 however this has gone back to regular right while in the room.  She is resting comfortably in no acute distress.  She does have equal pulses bilaterally however given elevated blood pressure with chest pain and some intermittent abdominal pain (however appears more chronic secondary to cholecystectomy) plan for CT dissection study.  Chest x-ray obtained which does not show any widened mediastinum. EKG unchanged since March.  We will add on CBC, BMP, troponin testing at this time.  We will also swab for COVID and flu as she complains of chills however is afebrile and does not have any other typical viral symptoms.  She denies any risk factors for PE at this time and I have low suspicion for same.  If large PE will be seen on dissection study.  We will plan for IV hydralazine to bring blood pressure down as well.   Istat chem 8 with potassium 3.4. Creatinine WNL 0.90  CTA Dissection: IMPRESSION:  1. No acute vascular abnormality.  2. Small hiatal hernia.  3. Otherwise no acute intrathoracic, intra-abdominal, intrapelvic  abnormality.   Troponin 3; will plan to repeat given chest pain started < 6 hours ago COVID and flu negative CBC without leukocytosis and hgb stable at 14.5  Repeat troponin 3 Blood pressure more controlled after hyrdalazine. Pt  instructed on importance of taking BP meds daily to prevent serious complications.   Vitals:   01/27/21 2150 01/27/21 2200 01/27/21 2230 01/27/21 2232  BP: (!) 204/109 (!) 207/110 (!) 196/103 (!) 196/103  01/27/21 2345 01/28/21 0000 01/28/21 0030 01/28/21 0100  BP: (!) 161/91 (!) 166/97 (!) 160/90 (!) 156/92   01/28/21 0130 01/28/21 0226  BP: (!) 141/91 (!) 160/92   Workup overall reassuring at this time. Will discharge home. Question hiatal hernia causing symptoms? Pt instructed to follow up with PCP for further eval. She is in agreement with plan and stable for discharge home.   This note was prepared using Dragon voice recognition software and may include unintentional dictation errors due to the inherent limitations of voice recognition software.   Final Clinical Impression(s) / ED Diagnoses Final diagnoses:  Atypical chest pain  Hiatal hernia  Primary hypertension    Rx / DC Orders ED Discharge Orders     None        Discharge Instructions      Your workup was overall reassuring in the ED today. Your CT scan did show an incidental finding of a very small hiatal hernia - please see attached information for same. This could be the cause of your symptoms today. Keep taking the Protonix you are prescribed and follow up with your PCP for further eval.   Please also take your blood pressure medication daily as prescribed. It is very important to not let your  blood pressure get too high for too long as it can cause serious complications including potential for organ damage/stroke.   Return to the ED for any new/worsening symptoms.        Tanda Rockers, PA-C 01/28/21 0321    Pricilla Loveless, MD 01/28/21 1556

## 2021-01-28 LAB — TROPONIN I (HIGH SENSITIVITY): Troponin I (High Sensitivity): 3 ng/L (ref <18)

## 2021-01-28 NOTE — Discharge Instructions (Addendum)
Your workup was overall reassuring in the ED today. Your CT scan did show an incidental finding of a very small hiatal hernia - please see attached information for same. This could be the cause of your symptoms today. Keep taking the Protonix you are prescribed and follow up with your PCP for further eval.   Please also take your blood pressure medication daily as prescribed. It is very important to not let your  blood pressure get too high for too long as it can cause serious complications including potential for organ damage/stroke.   Return to the ED for any new/worsening symptoms.

## 2021-01-29 ENCOUNTER — Emergency Department (HOSPITAL_COMMUNITY)
Admission: EM | Admit: 2021-01-29 | Discharge: 2021-01-30 | Disposition: A | Discharge status: Home / Self Care | Payer: 59 | Attending: Emergency Medicine | Admitting: Emergency Medicine

## 2021-01-29 ENCOUNTER — Other Ambulatory Visit: Payer: Self-pay

## 2021-01-29 ENCOUNTER — Emergency Department (HOSPITAL_COMMUNITY): Payer: 59

## 2021-01-29 ENCOUNTER — Encounter (HOSPITAL_COMMUNITY): Payer: Self-pay | Admitting: Emergency Medicine

## 2021-01-29 DIAGNOSIS — Z87891 Personal history of nicotine dependence: Secondary | ICD-10-CM | POA: Insufficient documentation

## 2021-01-29 DIAGNOSIS — I10 Essential (primary) hypertension: Secondary | ICD-10-CM | POA: Insufficient documentation

## 2021-01-29 DIAGNOSIS — Z79899 Other long term (current) drug therapy: Secondary | ICD-10-CM | POA: Insufficient documentation

## 2021-01-29 DIAGNOSIS — R519 Headache, unspecified: Secondary | ICD-10-CM | POA: Diagnosis present

## 2021-01-29 LAB — BASIC METABOLIC PANEL
Anion gap: 8 (ref 5–15)
BUN: 16 mg/dL (ref 6–20)
CO2: 27 mmol/L (ref 22–32)
Calcium: 9.4 mg/dL (ref 8.9–10.3)
Chloride: 104 mmol/L (ref 98–111)
Creatinine, Ser: 0.89 mg/dL (ref 0.44–1.00)
GFR, Estimated: >60 mL/min (ref >60)
Glucose, Bld: 109 mg/dL — ABNORMAL HIGH (ref 70–99)
Potassium: 3.9 mmol/L (ref 3.5–5.1)
Sodium: 139 mmol/L (ref 135–145)

## 2021-01-29 LAB — CBC WITH DIFFERENTIAL/PLATELET
Abs Immature Granulocytes: 0.03 10*3/uL (ref 0.00–0.07)
Basophils Absolute: 0.1 10*3/uL (ref 0.0–0.1)
Basophils Relative: 1 %
Eosinophils Absolute: 0.1 10*3/uL (ref 0.0–0.5)
Eosinophils Relative: 1 %
HCT: 45.3 % (ref 36.0–46.0)
Hemoglobin: 15 g/dL (ref 12.0–15.0)
Immature Granulocytes: 0 %
Lymphocytes Relative: 29 %
Lymphs Abs: 3.3 10*3/uL (ref 0.7–4.0)
MCH: 30.8 pg (ref 26.0–34.0)
MCHC: 33.1 g/dL (ref 30.0–36.0)
MCV: 93 fL (ref 80.0–100.0)
Monocytes Absolute: 0.7 10*3/uL (ref 0.1–1.0)
Monocytes Relative: 6 %
Neutro Abs: 7.1 10*3/uL (ref 1.7–7.7)
Neutrophils Relative %: 63 %
Platelets: 311 10*3/uL (ref 150–400)
RBC: 4.87 MIL/uL (ref 3.87–5.11)
RDW: 12.9 % (ref 11.5–15.5)
WBC: 11.4 10*3/uL — ABNORMAL HIGH (ref 4.0–10.5)
nRBC: 0 % (ref 0.0–0.2)

## 2021-01-29 NOTE — ED Provider Notes (Signed)
[facility]-EMERGENCY DEPT Provider Note   CSN: [id number] Arrival date & time: 01/29/21  1948     History Chief Complaint  Patient presents with   Hypertension    Gail Turner is a 60 y.o. female with PMHx HTN who presents to the ED today with complaint of elevated blood pressure reading earlier today.  Patient was seen in the ED 2 days ago by myself for chest pain and elevated blood pressure.  She had been noncompliant with her 5 mg amlodipine for over 10 days.  She states that she began taking her amlodipine earlier today for the first time.  Later on during the day she began experiencing a mild headache and checked her blood pressure and it was in the 180s systolic causing concern prompting return ED visit today.  She has an appointment with her PCP scheduled for Monday, 3 days from now.  She denies any speech changes, unilateral weakness or numbness, double vision, loss of vision, facial droop, any other associated symptoms.  She is no longer experiencing chest pain.   The history is provided by the patient, a relative and medical records.      Past Medical History:  Diagnosis Date   Anemia    Gallstones     Patient Active Problem List   Diagnosis Date Noted   Biliary colic 05/20/2018   Cholelithiasis with chronic cholecystitis 05/20/2018    Past Surgical History:  Procedure Laterality Date   ABDOMINAL HYSTERECTOMY     CARDIAC ELECTROPHYSIOLOGY STUDY AND ABLATION     CESAREAN SECTION     x 2   CHOLECYSTECTOMY N/A 05/20/2018   Procedure: LAPAROSCOPIC CHOLECYSTECTOMY WITH INTRAOPERATIVE CHOLANGIOGRAM;  Surgeon: Darnell Level, MD;  Location: [facility];  Service: General;  Laterality: N/A;     OB History   No obstetric history on file.     Family History  Problem Relation Age of Onset   Breast cancer Mother    Ovarian cancer Sister    Brain cancer Maternal Aunt    Colon cancer Neg Hx    Esophageal cancer Neg Hx    Stomach cancer Neg Hx     Pancreatic cancer Neg Hx    Liver disease Neg Hx     Social History   Tobacco Use   Smoking status: Former   Smokeless tobacco: Never  Building services engineer Use: Never used  Substance Use Topics   Alcohol use: Yes    Comment: 4-5 drinks weekly   Drug use: No    Home Medications Prior to Admission medications   Medication Sig Start Date End Date Taking? Authorizing Provider  amLODipine (NORVASC) 5 MG tablet Take 5 mg by mouth daily. 10/28/20  Yes [provider]  COLLAGEN PO Take 3 capsules by mouth at bedtime.   Yes [provider]  pantoprazole (PROTONIX) 40 MG tablet Take 40 mg by mouth daily. 01/09/21  Yes [provider]  acetaminophen (TYLENOL) 500 MG tablet You can take 1000 mg every 8 hours for pain.  This is your first-line pain medication.  You can use it for couple days and then see how you do without it.  You can buy this over-the-counter at any drugstore without a prescription.  Do not take more than 4000 mg of Tylenol(acetaminophen) it can harm your liver. Patient not taking: Reported on 01/29/2021 05/21/18   Sherrie George, PA-C  gabapentin (NEURONTIN) 300 MG capsule Take 300 mg by mouth daily. for 10 days 01/14/21  [provider]  ibuprofen (ADVIL,MOTRIN) 200 MG tablet You can take 2 to 3 tablets every 6 hours as needed for pain.  You can start this 2 or 3 hours after you have taken your initial Tylenol.  This is your second medicine to use for pain control.  You can alternate this with the plain Tylenol. You can buy this over-the-counter without a prescription at any drugstore.  Do not exceed 3 tablets every 6 hours for pain. Patient not taking: Reported on 01/29/2021 05/21/18   Earnstine Regal, PA-C  loperamide (IMODIUM) 2 MG capsule Take 1 capsule (2 mg total) by mouth 4 (four) times daily as needed for diarrhea or loose stools. Patient not taking: Reported on 01/28/2021 09/28/20   Varney Biles, MD    Allergies    Losartan and  Amoxicillin  Review of Systems   Review of Systems  Eyes:  Negative for visual disturbance.  Cardiovascular:  Negative for chest pain.  Gastrointestinal:  Negative for nausea and vomiting.  Neurological:  Positive for headaches. Negative for dizziness, speech difficulty, weakness, light-headedness and numbness.  All other systems reviewed and are negative.  Physical Exam Updated Vital Signs BP (!) 162/91 (BP Location: Left Arm)   Pulse 78   Temp 97.9 F (36.6 C) (Oral)   Resp 16   SpO2 99%   Physical Exam Vitals and nursing note reviewed.  Constitutional:      Appearance: She is not ill-appearing.  HENT:     Head: Normocephalic and atraumatic.  Eyes:     Extraocular Movements: Extraocular movements intact.     Conjunctiva/sclera: Conjunctivae normal.     Pupils: Pupils are equal, round, and reactive to light.  Cardiovascular:     Rate and Rhythm: Normal rate and regular rhythm.     Pulses: Normal pulses.  Pulmonary:     Effort: Pulmonary effort is normal.     Breath sounds: Normal breath sounds. No wheezing, rhonchi or rales.  Abdominal:     Palpations: Abdomen is soft.     Tenderness: There is no abdominal tenderness.  Musculoskeletal:     Cervical back: Neck supple.  Skin:    General: Skin is warm and dry.  Neurological:     General: No focal deficit present.     Mental Status: She is alert and oriented to person, place, and time.     Cranial Nerves: No cranial nerve deficit.    ED Results / Procedures / Treatments   Labs (all labs ordered are listed, but only abnormal results are displayed) Labs Reviewed  CBC WITH DIFFERENTIAL/PLATELET - Abnormal; Notable for the following components:      Result Value   WBC 11.4 (*)    All other components within normal limits  BASIC METABOLIC PANEL - Abnormal; Notable for the following components:   Glucose, Bld 109 (*)    All other components within normal limits    EKG None  Radiology CT HEAD WO CONTRAST  (5MM)  Result Date: 01/29/2021 CLINICAL DATA:  Headache, new or worsening. EXAM: CT HEAD WITHOUT CONTRAST TECHNIQUE: Contiguous axial images were obtained from the base of the skull through the vertex without intravenous contrast. COMPARISON:  None. FINDINGS: Brain: No acute intracranial hemorrhage, midline shift or mass effect. No extra-axial fluid collection. Gray-white matter differentiation is within normal limits and there is no hydrocephalus. Vascular: No hyperdense vessel or unexpected calcification. Skull: Normal. Negative for fracture or focal lesion. Sinuses/Orbits: No acute finding. Other: None. IMPRESSION: No acute intracranial process.  Electronically Signed   By: Thornell Sartorius M.D.   On: 01/29/2021 20:54    Procedures Procedures   Medications Ordered in ED Medications - No data to display  ED Course  I have reviewed the triage vital signs and the nursing notes.  Pertinent labs & imaging results that were available during my care of the patient were reviewed by me and considered in my medical decision making (see chart for details).    MDM Rules/Calculators/A&P                           60 year old female who presents to the ED today with complaint of elevated blood pressure reading.  Began taking 5 mg amlodipine earlier today after being noncompliant for several days.  On arrival to the ED blood pressure was noted to be 192/120.  She was medically screened, no acute neurodeficits at that time.  She had work-up started including CBC and BMP without acute abnormalities; no signs of endorgan damage.  CT head ordered as well given complaint of headache and elevated blood pressure readings, no acute findings.  On my exam blood pressure has slightly decreased at 162/91 without intervention.  Patient is resting comfortably in bed.  Continues to have no focal neurodeficits.  Have had a lengthy discussion with patient regarding the fact that it will take up to 2 weeks for her blood pressure  medication to adequately work.  She is encouraged to continue taking the 5 mg amlodipine daily and to follow-up with her PCP as scheduled on Monday of this upcoming week.  She is recommended to keep a log of her blood pressure readings to take with her.  She is encouraged to return to the ED for any new/worsening symptoms including severe thunderclap headache, vision changes, unilateral weakness or numbness, speech changes, facial droop, chest pain, urinary retention, any other new/concerning symptoms.  Patient is in agreement with plan at this time.  She is stable for discharge home.   This note was prepared using Dragon voice recognition software and may include unintentional dictation errors due to the inherent limitations of voice recognition software.   Final Clinical Impression(s) / ED Diagnoses Final diagnoses:  Primary hypertension    Rx / DC Orders ED Discharge Orders     None        Discharge Instructions      Please continue taking your  blood pressure medication daily as indicated. Keep a log of your blood pressure readings to take with you to your next PCP appointment as scheduled.   Return to the ED IMMEDIATELY for any new/worsening symptoms including severe headache, vision changes, unilateral weakness or numbness, speech changes, facial droop, chest pain, urinary retention, or any other new/concerning symptoms.        Tanda Rockers, PA-C 01/29/21 2330    Dione Booze, MD 01/30/21 [phone]

## 2021-01-29 NOTE — Discharge Instructions (Signed)
Please continue taking your  blood pressure medication daily as indicated. Keep a log of your blood pressure readings to take with you to your next PCP appointment as scheduled.   Return to the ED IMMEDIATELY for any new/worsening symptoms including severe headache, vision changes, unilateral weakness or numbness, speech changes, facial droop, chest pain, urinary retention, or any other new/concerning symptoms.

## 2021-01-29 NOTE — ED Triage Notes (Signed)
Patient reports elevated BP which she reports has been higher than normal x3 days. She reports being prescribed BP medication but has not been taking it. She reports occasional headaches but denies any other symptoms.

## 2021-01-29 NOTE — ED Provider Notes (Signed)
Emergency Medicine Provider Triage Evaluation Note  Nachelle Negrette , a 60 y.o. female  was evaluated in triage.  Pt complains of elevated blood pressure.  Was seen here 2 days ago for chest pain, noted elevated blood pressure at that time.  Patient states chest pain has resolved.  Noted today she had generalized headache.  Does not typically get headaches.  No slurred speech, unilateral weakness, numbness, gait abnormality.  Took her blood pressure which was greater than 200 systolic.  States she has been compliant with her home blood pressure medications.  Headache has significantly improved at this time however still slightly there  Review of Systems  Positive: Headache, elevated blood pressure Negative: Chest pain, shortness of breath,  Physical Exam  BP (!) 192/120 (BP Location: Right Arm)   Pulse 93   Temp 97.9 F (36.6 C) (Oral)   Resp 18   SpO2 98%  Gen:   Awake, no distress   Resp:  Normal effort  MSK:   Moves extremities without difficulty  Neuro:  CN 2-12 grossly intact, ambulatory without gait abnormality, intact sensation Other:    Medical Decision Making  Medically screening exam initiated at 8:07 PM.  Appropriate orders placed.  Maneh Sieben was informed that the remainder of the evaluation will be completed by another provider, this initial triage assessment does not replace that evaluation, and the importance of remaining in the ED until their evaluation is complete.  HA, HTN   Findlay Dagher A, PA-C 01/29/21 2008    Terrilee Files, MD 01/30/21 1048

## 2021-03-22 ENCOUNTER — Encounter: Payer: Self-pay | Admitting: Physician Assistant

## 2021-03-24 ENCOUNTER — Encounter (HOSPITAL_COMMUNITY): Payer: Self-pay | Admitting: Emergency Medicine

## 2021-03-30 ENCOUNTER — Other Ambulatory Visit: Payer: Self-pay

## 2021-03-30 ENCOUNTER — Encounter (HOSPITAL_COMMUNITY): Payer: Self-pay

## 2021-03-30 ENCOUNTER — Emergency Department (HOSPITAL_COMMUNITY)
Admission: EM | Admit: 2021-03-30 | Discharge: 2021-03-30 | Disposition: A | Discharge status: Home / Self Care | Payer: Self-pay | Attending: Emergency Medicine | Admitting: Emergency Medicine

## 2021-03-30 DIAGNOSIS — R6883 Chills (without fever): Secondary | ICD-10-CM | POA: Insufficient documentation

## 2021-03-30 DIAGNOSIS — R143 Flatulence: Secondary | ICD-10-CM | POA: Insufficient documentation

## 2021-03-30 DIAGNOSIS — R030 Elevated blood-pressure reading, without diagnosis of hypertension: Secondary | ICD-10-CM | POA: Insufficient documentation

## 2021-03-30 DIAGNOSIS — Z5321 Procedure and treatment not carried out due to patient leaving prior to being seen by health care provider: Secondary | ICD-10-CM | POA: Insufficient documentation

## 2021-03-30 DIAGNOSIS — R5383 Other fatigue: Secondary | ICD-10-CM | POA: Insufficient documentation

## 2021-03-30 NOTE — ED Triage Notes (Signed)
Patient reports that she has yawning episodes when her BP is up and has a chill inside her body. Patient also reports that she has a lot of gas and the gas shoots her BP up and feeling fatigue  BP in triage- 166/102.

## 2021-12-19 ENCOUNTER — Emergency Department (HOSPITAL_COMMUNITY)
Admission: EM | Admit: 2021-12-19 | Discharge: 2021-12-19 | Discharge status: Against Medical Advice | Payer: Self-pay | Attending: Emergency Medicine | Admitting: Emergency Medicine

## 2021-12-19 ENCOUNTER — Encounter (HOSPITAL_COMMUNITY): Payer: Self-pay

## 2021-12-19 ENCOUNTER — Emergency Department (HOSPITAL_COMMUNITY): Payer: Self-pay

## 2021-12-19 DIAGNOSIS — Z5321 Procedure and treatment not carried out due to patient leaving prior to being seen by health care provider: Secondary | ICD-10-CM | POA: Insufficient documentation

## 2021-12-19 DIAGNOSIS — R0602 Shortness of breath: Secondary | ICD-10-CM | POA: Insufficient documentation

## 2021-12-19 DIAGNOSIS — R110 Nausea: Secondary | ICD-10-CM | POA: Insufficient documentation

## 2021-12-19 DIAGNOSIS — R0789 Other chest pain: Secondary | ICD-10-CM | POA: Insufficient documentation

## 2021-12-19 LAB — BASIC METABOLIC PANEL
Anion gap: 7 (ref 5–15)
BUN: 20 mg/dL (ref 6–20)
CO2: 23 mmol/L (ref 22–32)
Calcium: 8.7 mg/dL — ABNORMAL LOW (ref 8.9–10.3)
Chloride: 109 mmol/L (ref 98–111)
Creatinine, Ser: 0.75 mg/dL (ref 0.44–1.00)
GFR, Estimated: >60 mL/min (ref >60)
Glucose, Bld: 129 mg/dL — ABNORMAL HIGH (ref 70–99)
Potassium: 3.6 mmol/L (ref 3.5–5.1)
Sodium: 139 mmol/L (ref 135–145)

## 2021-12-19 LAB — CBC
HCT: 41.4 % (ref 36.0–46.0)
Hemoglobin: 13.5 g/dL (ref 12.0–15.0)
MCH: 31 pg (ref 26.0–34.0)
MCHC: 32.6 g/dL (ref 30.0–36.0)
MCV: 95.2 fL (ref 80.0–100.0)
Platelets: 261 10*3/uL (ref 150–400)
RBC: 4.35 MIL/uL (ref 3.87–5.11)
RDW: 13.2 % (ref 11.5–15.5)
WBC: 9.3 10*3/uL (ref 4.0–10.5)
nRBC: 0 % (ref 0.0–0.2)

## 2021-12-19 LAB — TROPONIN I (HIGH SENSITIVITY): Troponin I (High Sensitivity): <2 ng/L (ref <18)

## 2021-12-19 NOTE — ED Triage Notes (Signed)
Pt reports chest pain and tightness with nausea, some SOB that started about 40 minutes ago

## 2022-06-30 ENCOUNTER — Emergency Department (HOSPITAL_BASED_OUTPATIENT_CLINIC_OR_DEPARTMENT_OTHER)
Admission: EM | Admit: 2022-06-30 | Discharge: 2022-06-30 | Disposition: A | Discharge status: Home / Self Care | Payer: 59 | Attending: Emergency Medicine | Admitting: Emergency Medicine

## 2022-06-30 ENCOUNTER — Other Ambulatory Visit: Payer: Self-pay

## 2022-06-30 DIAGNOSIS — R197 Diarrhea, unspecified: Secondary | ICD-10-CM | POA: Insufficient documentation

## 2022-06-30 LAB — COMPREHENSIVE METABOLIC PANEL
ALT: 18 U/L (ref 0–44)
AST: 20 U/L (ref 15–41)
Albumin: 4.2 g/dL (ref 3.5–5.0)
Alkaline Phosphatase: 69 U/L (ref 38–126)
Anion gap: 8 (ref 5–15)
BUN: 17 mg/dL (ref 6–20)
CO2: 25 mmol/L (ref 22–32)
Calcium: 9.2 mg/dL (ref 8.9–10.3)
Chloride: 107 mmol/L (ref 98–111)
Creatinine, Ser: 0.6 mg/dL (ref 0.44–1.00)
GFR, Estimated: >60 mL/min (ref >60)
Glucose, Bld: 111 mg/dL — ABNORMAL HIGH (ref 70–99)
Potassium: 3.7 mmol/L (ref 3.5–5.1)
Sodium: 140 mmol/L (ref 135–145)
Total Bilirubin: 0.4 mg/dL (ref 0.3–1.2)
Total Protein: 7.1 g/dL (ref 6.5–8.1)

## 2022-06-30 LAB — URINALYSIS, ROUTINE W REFLEX MICROSCOPIC
Bacteria, UA: NONE SEEN
Bilirubin Urine: NEGATIVE
Glucose, UA: NEGATIVE mg/dL
Hgb urine dipstick: NEGATIVE
Ketones, ur: NEGATIVE mg/dL
Leukocytes,Ua: NEGATIVE
Nitrite: NEGATIVE
Protein, ur: NEGATIVE mg/dL
Specific Gravity, Urine: 1.022 (ref 1.005–1.030)
pH: 6 (ref 5.0–8.0)

## 2022-06-30 LAB — LIPASE, BLOOD: Lipase: 28 U/L (ref 11–51)

## 2022-06-30 LAB — CBC
HCT: 42.7 % (ref 36.0–46.0)
Hemoglobin: 14.3 g/dL (ref 12.0–15.0)
MCH: 31 pg (ref 26.0–34.0)
MCHC: 33.5 g/dL (ref 30.0–36.0)
MCV: 92.4 fL (ref 80.0–100.0)
Platelets: 299 10*3/uL (ref 150–400)
RBC: 4.62 MIL/uL (ref 3.87–5.11)
RDW: 13.4 % (ref 11.5–15.5)
WBC: 8.4 10*3/uL (ref 4.0–10.5)
nRBC: 0 % (ref 0.0–0.2)

## 2022-06-30 NOTE — ED Provider Notes (Signed)
[place] EMERGENCY DEPARTMENT AT [facility] Provider Note   CSN: [id number] Arrival date & time: 06/30/22  1323     History  Chief Complaint  Patient presents with   Abdominal Pain    Gail Turner is a 62 y.o. female status post cholecystectomy presented with morning loose stools for the past 2 weeks.  Patient states that her daughter had a GI bug and believes she had a GI bug from her as well.  Patient states she has been able to eat and drink without issue however when she wakes up in morning she has 2-3 loose stools that eventually resolved by the afternoon.  Patient states she eats rice and bone broth but still endorses symptoms.  Patient tried Imodium for 1 dose and symptoms resolved however symptoms returned the day after.  Patient also believes that both of her legs appear swollen since the diarrhea has started but denies any changes in gait, change in sensation/motor skills.  Patient denies any recent antibiotic use or hospitalizations or travel or new foods.  Patient denied any fevers, shortness of breath, chest pain, dysuria, hematochezia.    Home Medications Prior to Admission medications   Medication Sig Start Date End Date Taking? Authorizing Provider  acetaminophen (TYLENOL) 500 MG tablet You can take 1000 mg every 8 hours for pain.  This is your first-line pain medication.  You can use it for couple days and then see how you do without it.  You can buy this over-the-counter at any drugstore without a prescription.  Do not take more than 4000 mg of Tylenol(acetaminophen) it can harm your liver. Patient not taking: Reported on 01/29/2021 05/21/18   Sherrie George, PA-C  amLODipine (NORVASC) 5 MG tablet Take 5 mg by mouth daily. 10/28/20   [provider]  COLLAGEN PO Take 3 capsules by mouth at bedtime.    [provider]  gabapentin (NEURONTIN) 300 MG capsule Take 300 mg by mouth daily. for 10 days 01/14/21   [provider]   ibuprofen (ADVIL,MOTRIN) 200 MG tablet You can take 2 to 3 tablets every 6 hours as needed for pain.  You can start this 2 or 3 hours after you have taken your initial Tylenol.  This is your second medicine to use for pain control.  You can alternate this with the plain Tylenol. You can buy this over-the-counter without a prescription at any drugstore.  Do not exceed 3 tablets every 6 hours for pain. Patient not taking: Reported on 01/29/2021 05/21/18   Sherrie George, PA-C  loperamide (IMODIUM) 2 MG capsule Take 1 capsule (2 mg total) by mouth 4 (four) times daily as needed for diarrhea or loose stools. Patient not taking: Reported on 01/28/2021 09/28/20   Derwood Kaplan, MD  pantoprazole (PROTONIX) 40 MG tablet Take 40 mg by mouth daily. 01/09/21   [provider]      Allergies    Losartan and Amoxicillin    Review of Systems   Review of Systems  Gastrointestinal:  Positive for abdominal pain.  See HPI  Physical Exam Updated Vital Signs BP (!) 166/94 (BP Location: Right Arm)   Pulse 80   Temp 98.1 F (36.7 C)   Resp 18   Ht 5' 3.5" (1.613 m)   Wt 90.7 kg   SpO2 98%   BMI 34.87 kg/m  Physical Exam Constitutional:      General: She is not in acute distress. Eyes:     Extraocular Movements: Extraocular movements intact.  Conjunctiva/sclera: Conjunctivae normal.     Pupils: Pupils are equal, round, and reactive to light.  Cardiovascular:     Rate and Rhythm: Normal rate and regular rhythm.     Pulses: Normal pulses.     Heart sounds: Normal heart sounds. No murmur heard. Pulmonary:     Effort: Pulmonary effort is normal. No respiratory distress.     Breath sounds: Normal breath sounds.  Abdominal:     Palpations: Abdomen is soft.     Tenderness: There is no abdominal tenderness. There is no guarding or rebound.  Musculoskeletal:        General: Normal range of motion.     Right lower leg: No edema.     Left lower leg: No edema.     Comments: No signs of  edema in all 4 extremities  Skin:    General: Skin is warm and dry.     Capillary Refill: Capillary refill takes less than 2 seconds.     Comments: No overlying skin color changes No varicose veins noted  Neurological:     Mental Status: She is alert.     Comments: Sensation intact in all 4 limbs  Psychiatric:        Mood and Affect: Mood normal.     ED Results / Procedures / Treatments   Labs (all labs ordered are listed, but only abnormal results are displayed) Labs Reviewed  COMPREHENSIVE METABOLIC PANEL - Abnormal; Notable for the following components:      Result Value   Glucose, Bld 111 (*)    All other components within normal limits  LIPASE, BLOOD  CBC  URINALYSIS, ROUTINE W REFLEX MICROSCOPIC    EKG EKG Interpretation  Date/Time:  Thursday June 30 2022 14:08:15 EDT Ventricular Rate:  85 PR Interval:  176 QRS Duration: 98 QT Interval:  366 QTC Calculation: 435 R Axis:   99 Text Interpretation: Normal sinus rhythm Rightward axis Borderline ECG When compared with ECG of 19-Dec-2021 01:02, PREVIOUS ECG IS PRESENT Confirmed by Margarita Grizzle [phone]) on 06/30/2022 2:49:41 PM  Radiology No results found.  Procedures Procedures    Medications Ordered in ED Medications - No data to display  ED Course/ Medical Decision Making/ A&P                             Medical Decision Making Amount and/or Complexity of Data Reviewed Labs: ordered.   Gail Turner 62 y.o. presented today for diarrhea and bilateral leg swelling. Working DDx that I considered at this time includes, but not limited to, acute gastroenteritis, electrolyte abnormalities, CHF, cirrhosis, varicose veins, dehydration.  R/o DDx: electrolyte abnormalities, CHF, cirrhosis, varicose veins, dehydration: These are considered less likely due to history of present illness and physical exam findings  Review of prior external notes: 12/19/2021 ED  Unique Tests and My Interpretation:  Lipase:  Unremarkable CBC: Unremarkable CMP: Unremarkable UA: Unremarkable EKG: Sinus 85 bpm, no ST abnormalities or blocks noted  Discussion with Independent Historian: None  Discussion of Management of Tests: None  Risk: Low: based on diagnostic testing/clinical impression and treatment plan  Risk Stratification Score: None  Plan: Patient presented for diarrhea and leg swelling. On exam patient was in no acute distress and stable vitals.  Patient stated that she has a sick contact that the same symptoms and has been having symptoms of the past 2 weeks.  Patient states that she has loose stools in the morning that  eventually resolved.  Patient had unremarkable physical exam and I did not appreciate any leg edema or overlying skin color changes in her legs.  Patient denied any recent travel, antibiotic use, hospitalizations, new food.  Patient had good pulses motor and sensation all 4 limbs did not appear fluid overloaded.  I suspect at this time patient most likely has a gastroenteritis as she has sick contacts with same symptoms and encouraged her to continue taking Imodium daily as 1 dose appeared to help for short period of time.  In terms of patient's leg edema spoke to the patient about monitoring symptoms and getting compression socks over the counter and to monitor for any skin color changes, change in sensation is motor skills.  I spoke to the patient about adding a BNP onto her labs to assess for possible heart failure due to her leg swelling and the patient not endorsing chest pain shortness of breath or cough and patient with full decision made capacity stated that she did not believe the test was necessary.  I spoke to the patient and she stated that she can get the Imodium along with pression socks over-the-counter and also get fiber supplements over-the-counter as well and does not need them prescribed.  Patient was given return precautions. Patient stable for discharge at this time.  Patient  verbalized understanding of plan.         Final Clinical Impression(s) / ED Diagnoses Final diagnoses:  Diarrhea, unspecified type    Rx / DC Orders ED Discharge Orders     None         Remi Deter 06/30/22 1602    Margarita Grizzle, MD 06/30/22 1650

## 2022-06-30 NOTE — Discharge Instructions (Signed)
Please follow-up with your primary care provider or the one I have attached your for you regarding recent symptoms and ER visit.  Today your labs and EKG were reassuring for any electrolyte abnormalities or heart conditions causing your diarrhea.  On exam as we discussed I did not note any leg swelling but I want you to continue to monitor your symptoms and if symptoms begin to change or worsen please return to ER.  Please take Imodium over-the-counter daily for your diarrhea and eat a diet high in fiber or take fiber supplements over-the-counter.  You may also try compression socks for your leg swelling that you can get over-the-counter as well.

## 2022-06-30 NOTE — ED Triage Notes (Addendum)
Arrives POV from home. Ambulatory to triage. A+Ox4. NAD.  Multiple complaints. Reports 2 weeks ago stomach virus-diarrhea for 2 days. Has had labile stomach upset since. Some lower back pain today, HA, and notices swelling in legs. HX gallbladder removed, and cardiac ablation. Some burning/pressure central chest. Denies SOB. Does not take any meds.

## 2022-06-30 NOTE — ED Notes (Signed)
Pt discharged to home using teachback Method. Discharge instructions have been discussed with patient and/or family members. Pt verbally acknowledges understanding d/c instructions, has been given opportunity for questions to be answered, and endorses comprehension to checkout at registration before leaving.  

## 2022-08-08 ENCOUNTER — Telehealth: Payer: Self-pay | Admitting: Gastroenterology

## 2022-08-08 NOTE — Telephone Encounter (Signed)
Faxed send to [facility] requesting patient records... Patient want to see Dr.Mansouraty and is requesting this transfer of care because she is not happy with current provider.

## 2022-08-15 ENCOUNTER — Telehealth: Payer: Self-pay | Admitting: Internal Medicine

## 2022-08-15 NOTE — Telephone Encounter (Signed)
Ok for an appt with APP or available physician

## 2022-08-15 NOTE — Telephone Encounter (Signed)
Hi Dr. Rhea Belton,  Supervising Provider: 08/15/22-AM  We received a referral for patient o be evaluated for upper abdominal pain, History of IBS and hiatal hernia. The patient has GI history with [facility] last seen about 2 yrs ago. Her records were obtained and scanned into [employer] for you to review and advise on scheduling.     Thank you

## 2022-08-17 ENCOUNTER — Encounter: Payer: Self-pay | Admitting: Gastroenterology

## 2022-09-19 ENCOUNTER — Ambulatory Visit: Payer: 59 | Admitting: Gastroenterology

## 2022-09-19 ENCOUNTER — Encounter: Payer: Self-pay | Admitting: Gastroenterology

## 2022-09-19 VITALS — BP 146/86 | HR 75 | Ht 64.0 in | Wt 242.1 lb

## 2022-09-19 DIAGNOSIS — R1013 Epigastric pain: Secondary | ICD-10-CM | POA: Diagnosis not present

## 2022-09-19 DIAGNOSIS — K219 Gastro-esophageal reflux disease without esophagitis: Secondary | ICD-10-CM | POA: Diagnosis not present

## 2022-09-19 DIAGNOSIS — R142 Eructation: Secondary | ICD-10-CM

## 2022-09-19 DIAGNOSIS — K449 Diaphragmatic hernia without obstruction or gangrene: Secondary | ICD-10-CM

## 2022-09-19 NOTE — Progress Notes (Signed)
Chief Complaint: hiatal hernia Primary GI ZO:[id number] - new patient  HPI: 62 year old female history of hypertension, GERD, hiatal hernia, s/p cholecystectomy, s/p hysterectomy presents for transfer of care  [facility] and transferring her GI care here to request Dr. Meridee Score  Patient recently seen at [facility] emergency department 06/30/2022 with abdominal pain and diarrhea.  Diagnosed with suspected gastroenteritis due to patient sick contacts with similar symptoms. Symptoms have resolved.  Recent CMP, CBC, Lipase normal.  Patient states she is here today because of concerns of her hiatal hernia. Reports in 2022 during her EGD she was diagnosed with a small hiatal hernia. States she feels like she is having worsening symptoms  Reports increased eructation intermittently through the day. Also has some epigastric pain which feels like trapped gas. States often times after eating she has to "hold her breasts up in order to get a burp out" and this provides relief. Some mild GERD. She also states soon after eating she may have some nausea and lightheadedness when she feels her food digesting.  She was previously on Zegerid with relief but is no longer on the medication.  Reports history of IBS (alternating diarrhea/constipation) which is currently well controlled on a probiotic.  PREVIOUS GI WORKUP   EGD 12/2020 for GERD and epigastric pain noted H. pylori negative gastritis.  Colonoscopy 04/2019 for screening, normal, repeat 2031  Past Medical History:  Diagnosis Date   Anemia    Gallstones     Past Surgical History:  Procedure Laterality Date   ABDOMINAL HYSTERECTOMY     CARDIAC ELECTROPHYSIOLOGY STUDY AND ABLATION     CESAREAN SECTION     x 2   CHOLECYSTECTOMY N/A 05/20/2018   Procedure: LAPAROSCOPIC CHOLECYSTECTOMY WITH INTRAOPERATIVE CHOLANGIOGRAM;  Surgeon: Darnell Level, MD;  Location: [facility];  Service: General;  Laterality: N/A;     Current Outpatient Medications  Medication Sig Dispense Refill   acetaminophen (TYLENOL) 500 MG tablet You can take 1000 mg every 8 hours for pain.  This is your first-line pain medication.  You can use it for couple days and then see how you do without it.  You can buy this over-the-counter at any drugstore without a prescription.  Do not take more than 4000 mg of Tylenol(acetaminophen) it can harm your liver. (Patient not taking: Reported on 01/29/2021) 30 tablet 0   amLODipine (NORVASC) 5 MG tablet Take 5 mg by mouth daily.     COLLAGEN PO Take 3 capsules by mouth at bedtime.     gabapentin (NEURONTIN) 300 MG capsule Take 300 mg by mouth daily. for 10 days     ibuprofen (ADVIL,MOTRIN) 200 MG tablet You can take 2 to 3 tablets every 6 hours as needed for pain.  You can start this 2 or 3 hours after you have taken your initial Tylenol.  This is your second medicine to use for pain control.  You can alternate this with the plain Tylenol. You can buy this over-the-counter without a prescription at any drugstore.  Do not exceed 3 tablets every 6 hours for pain. (Patient not taking: Reported on 01/29/2021)     loperamide (IMODIUM) 2 MG capsule Take 1 capsule (2 mg total) by mouth 4 (four) times daily as needed for diarrhea or loose stools. (Patient not taking: Reported on 01/28/2021) 12 capsule 0   pantoprazole (PROTONIX) 40 MG tablet Take 40 mg by mouth daily.     No current facility-administered medications for this visit.  Allergies as of 09/19/2022 - Reviewed 06/30/2022  Allergen Reaction Noted   Losartan Diarrhea 01/28/2021   Amoxicillin Palpitations 07/15/2017    Family History  Problem Relation Age of Onset   Breast cancer Mother    Ovarian cancer Sister    Brain cancer Maternal Aunt    Colon cancer Neg Hx    Esophageal cancer Neg Hx    Stomach cancer Neg Hx    Pancreatic cancer Neg Hx    Liver disease Neg Hx     Social History   Socioeconomic History   Marital status:  Legally Separated    Spouse name: Not on file   Number of children: Not on file   Years of education: Not on file   Highest education level: Not on file  Occupational History   Not on file  Tobacco Use   Smoking status: Former   Smokeless tobacco: Never  Vaping Use   Vaping status: Never Used  Substance and Sexual Activity   Alcohol use: Yes    Comment: 4-5 drinks weekly   Drug use: No   Sexual activity: Not on file  Other Topics Concern   Not on file  Social History Narrative   ** Merged History Encounter **       Social Determinants of Health   Financial Resource Strain: Medium Risk (01/14/2021)   Received from [facility], [facility]   Overall Financial Resource Strain (CARDIA)    Difficulty of Paying Living Expenses: Somewhat hard  Food Insecurity: No Food Insecurity (01/14/2021)   Received from [facility], [facility]   Hunger Vital Sign    Worried About Running Out of Food in the Last Year: Never true    Ran Out of Food in the Last Year: Never true  Transportation Needs: No Transportation Needs (01/14/2021)   Received from [employer], [facility]   PRAPARE - Transportation    Lack of Transportation (Medical): No    Lack of Transportation (Non-Medical): No  Physical Activity: Insufficiently Active (01/14/2021)   Received from [facility], [facility]   Exercise Vital Sign    Days of Exercise per Week: 4 days    Minutes of Exercise per Session: 10 min  Stress: No Stress Concern Present (01/14/2021)   Received from [facility], [facility] of Occupational Health - Occupational Stress Questionnaire    Feeling of Stress : Not at all  Social Connections: Unknown (07/17/2021)   Received from [facility], [facility]   Social Network    Social Network: Not on file  Intimate Partner Violence: Unknown (06/09/2021)   Received from [facility], [facility]   HITS    Physically Hurt: Not on file    Insult or Talk Down  To: Not on file    Threaten Physical Harm: Not on file    Scream or Curse: Not on file    Review of Systems:    Constitutional: No weight loss, fever, chills, weakness or fatigue HEENT: Eyes: No change in vision               Ears, Nose, Throat:  No change in hearing or congestion Skin: No rash or itching Cardiovascular: No chest pain, chest pressure or palpitations   Respiratory: No SOB or cough Gastrointestinal: See HPI and otherwise negative Genitourinary: No dysuria or change in urinary frequency Neurological: No headache, dizziness or syncope Musculoskeletal: No new muscle or joint pain Hematologic: No bleeding or bruising Psychiatric: No history of depression  or anxiety    Physical Exam:  Vital signs: There were no vitals taken for this visit.  Constitutional: NAD, obese, alert and cooperative Head:  Normocephalic and atraumatic. Eyes:   PEERL, EOMI. No icterus. Conjunctiva pink. Respiratory: Respirations even and unlabored. Lungs clear to auscultation bilaterally.   No wheezes, crackles, or rhonchi.  Cardiovascular:  Regular rate and rhythm. No peripheral edema, cyanosis or pallor.  Gastrointestinal:  Soft, nondistended, nontender. No rebound or guarding. Normal bowel sounds. No appreciable masses or hepatomegaly. Rectal:  Not performed.  Msk:  Symmetrical without gross deformities. Without edema, no deformity or joint abnormality.  Neurologic:  Alert and  oriented x4;  grossly normal neurologically.  Skin:   Dry and intact without significant lesions or rashes. Psychiatric: Oriented to person, place and time. Demonstrates good judgement and reason without abnormal affect or behaviors.   RELEVANT LABS AND IMAGING: CBC    Component Value Date/Time   WBC 8.4 06/30/2022 1401   RBC 4.62 06/30/2022 1401   HGB 14.3 06/30/2022 1401   HCT 42.7 06/30/2022 1401   PLT 299 06/30/2022 1401   MCV 92.4 06/30/2022 1401   MCH 31.0 06/30/2022 1401   MCHC 33.5 06/30/2022 1401    RDW 13.4 06/30/2022 1401   LYMPHSABS 3.3 01/29/2021 2017   MONOABS 0.7 01/29/2021 2017   EOSABS 0.1 01/29/2021 2017   BASOSABS 0.1 01/29/2021 2017    CMP     Component Value Date/Time   NA 140 06/30/2022 1401   K 3.7 06/30/2022 1401   CL 107 06/30/2022 1401   CO2 25 06/30/2022 1401   GLUCOSE 111 (H) 06/30/2022 1401   BUN 17 06/30/2022 1401   CREATININE 0.60 06/30/2022 1401   CALCIUM 9.2 06/30/2022 1401   PROT 7.1 06/30/2022 1401   ALBUMIN 4.2 06/30/2022 1401   AST 20 06/30/2022 1401   ALT 18 06/30/2022 1401   ALKPHOS 69 06/30/2022 1401   BILITOT 0.4 06/30/2022 1401   GFRNONAA >60 06/30/2022 1401   GFRAA >60 07/05/2018 0930     Assessment/Plan:   Hiatal hernia with GERD Abdominal pain, epigastric Eructation Nausea GERD currently uncontrolled and unmedicated with subsequent eructation and epigastric gas pain. Hiatal hernia likely resulting in worsening symptoms. Discussed with patient about hiatal hernias and provided patient education.  - GERD patient education - low dose Zegerid - if no improvement, can consider repeat EGD for further evaluation versus gastric emptying study.  IBS - well controlled on probiotic.  Lara Mulch [facility] 09/19/2022, 11:24 AM  Cc: Bryon Lions, PA-C

## 2022-09-19 NOTE — Patient Instructions (Addendum)
_______________________________________________________  If your blood pressure at your visit was 140/90 or greater, please contact your primary care physician to follow up on this.  _______________________________________________________  If you are age 61 or older, your body mass index should be between 23-30. Your Body mass index is 41.56 kg/m. If this is out of the aforementioned range listed, please consider follow up with your Primary Care Provider.  If you are age 63 or younger, your body mass index should be between 19-25. Your Body mass index is 41.56 kg/m. If this is out of the aformentioned range listed, please consider follow up with your Primary Care Provider.   ________________________________________________________  The [facility] providers would like to encourage you to use [facility] to communicate with providers for non-urgent requests or questions.  Due to long hold times on the telephone, sending your provider a message by [facility] may be a faster and more efficient way to get a response.  Please allow 48 business hours for a response.  Please remember that this is for non-urgent requests.  _______________________________________________________  Please purchase the following medications over the counter and take as directed: Antiacid  Primary care appointment with Hilbert Bible, FNP at the [facility] at [facility] Address: [address], [place], [place] [address] Phone: [phone]  You have been scheduled for an appointment with Boone Master PA-C on 12-21-2022 at 2pm . Please arrive 10 minutes early for your appointment.  Please call with any questions or concerns.  It was a pleasure to see you today!  Thank you for trusting me with your gastrointestinal care!

## 2022-09-21 NOTE — Progress Notes (Signed)
Attending Physician's Attestation   I have reviewed the chart.   I agree with the Advanced Practitioner's note, impression, and recommendations with any updates as below. Agree with optimization of medication therapy.  If her symptoms improved significantly, then reasonable to just maintain on PPI therapy.  In the interim, could also consider a barium swallow to evaluate overall reflux if desired.  Otherwise if doing okay plan for medication therapy and proceeding with endoscopy if symptoms are persistent and follow-up in clinic.   Corliss Parish, MD [facility] Advanced Endoscopy Office # [phone]

## 2022-10-19 NOTE — Progress Notes (Unsigned)
New Patient Office Visit  Subjective:   Gail Turner May 27, 1960 10/20/2022  No chief complaint on file.   HPI: Gail Turner presents today to establish care at Primary Care and Sports Medicine at [facility]. Introduced to Publishing rights manager role and practice setting.  All questions answered.   Last PCP: *** Last annual physical: *** Concerns: See below    The following portions of the patient's history were reviewed and updated as appropriate: past medical history, past surgical history, family history, social history, allergies, medications, and problem list.   Patient Active Problem List   Diagnosis Date Noted   Biliary colic 05/20/2018   Cholelithiasis with chronic cholecystitis 05/20/2018   Past Medical History:  Diagnosis Date   Anemia    Gallstones    Hiatal hernia    IBS (irritable bowel syndrome)    Past Surgical History:  Procedure Laterality Date   ABDOMINAL HYSTERECTOMY     CARDIAC ELECTROPHYSIOLOGY STUDY AND ABLATION     CESAREAN SECTION     x 2   CHOLECYSTECTOMY N/A 05/20/2018   Procedure: LAPAROSCOPIC CHOLECYSTECTOMY WITH INTRAOPERATIVE CHOLANGIOGRAM;  Surgeon: Darnell Level, MD;  Location: [facility];  Service: General;  Laterality: N/A;   Family History  Problem Relation Age of Onset   Breast cancer Mother    Ovarian cancer Sister    Brain cancer Maternal Aunt    Colon cancer Neg Hx    Esophageal cancer Neg Hx    Stomach cancer Neg Hx    Pancreatic cancer Neg Hx    Liver disease Neg Hx    Social History   Socioeconomic History   Marital status: Legally Separated    Spouse name: Not on file   Number of children: Not on file   Years of education: Not on file   Highest education level: Not on file  Occupational History   Not on file  Tobacco Use   Smoking status: Former   Smokeless tobacco: Never  Vaping Use   Vaping status: Never Used  Substance and Sexual Activity   Alcohol use: Not Currently    Comment: 4-5  drinks weekly   Drug use: No   Sexual activity: Not on file  Other Topics Concern   Not on file  Social History Narrative   ** Merged History Encounter **       Social Determinants of Health   Financial Resource Strain: Medium Risk (01/14/2021)   Received from [employer], [facility]   Overall Financial Resource Strain (CARDIA)    Difficulty of Paying Living Expenses: Somewhat hard  Food Insecurity: No Food Insecurity (01/14/2021)   Received from [facility], [facility]   Hunger Vital Sign    Worried About Running Out of Food in the Last Year: Never true    Ran Out of Food in the Last Year: Never true  Transportation Needs: No Transportation Needs (01/14/2021)   Received from [employer], [facility]   PRAPARE - Transportation    Lack of Transportation (Medical): No    Lack of Transportation (Non-Medical): No  Physical Activity: Insufficiently Active (01/14/2021)   Received from [facility], [facility]   Exercise Vital Sign    Days of Exercise per Week: 4 days    Minutes of Exercise per Session: 10 min  Stress: No Stress Concern Present (01/14/2021)   Received from [facility], [facility] of Occupational Health - Occupational Stress Questionnaire    Feeling of Stress : Not at  all  Social Connections: Unknown (07/17/2021)   Received from [facility], [facility]   Social Network    Social Network: Not on file  Intimate Partner Violence: Unknown (06/09/2021)   Received from [facility], [facility]   HITS    Physically Hurt: Not on file    Insult or Talk Down To: Not on file    Threaten Physical Harm: Not on file    Scream or Curse: Not on file   Outpatient Medications Prior to Visit  Medication Sig Dispense Refill   AMBULATORY NON FORMULARY MEDICATION at bedtime. Medication Name: 10 mushroom blend 1 scoop     COLLAGEN PO Take 1 Scoop by mouth at bedtime.     Multiple Vitamin (MULTIVITAMIN ADULT PO) Take 1 Scoop by mouth  daily.     Probiotic Product (PROBIOTIC PO) Take 2 tablets by mouth daily.     No facility-administered medications prior to visit.   Allergies  Allergen Reactions   Losartan Diarrhea   Amoxicillin Palpitations    Did it involve swelling of the face/tongue/throat, SOB, or low BP? Yes Did it involve sudden or severe rash/hives, skin peeling, or any reaction on the inside of your mouth or nose? No Did you need to seek medical attention at a hospital or doctor's office? No When did it last happen?      unknown  If all above answers are "NO", may proceed with cephalosporin use.     ROS: A complete ROS was performed with pertinent positives/negatives noted in the HPI. The remainder of the ROS are negative.   Objective:   There were no vitals filed for this visit.  GENERAL: Well-appearing, in NAD. Well nourished.  SKIN: Pink, warm and dry. No rash, lesion, ulceration, or ecchymoses.  Head: Normocephalic. NECK: Trachea midline. Full ROM w/o pain or tenderness. No lymphadenopathy.  EARS: Tympanic membranes are intact, translucent without bulging and without drainage. Appropriate landmarks visualized.  EYES: Conjunctiva clear without exudates. EOMI, PERRL, no drainage present.  NOSE: Septum midline w/o deformity. Nares patent, mucosa pink and non-inflamed w/o drainage. No sinus tenderness.  THROAT: Uvula midline. Oropharynx clear. Tonsils non-inflamed without exudate. Mucous membranes pink and moist.  RESPIRATORY: Chest wall symmetrical. Respirations even and non-labored. Breath sounds clear to auscultation bilaterally.  CARDIAC: S1, S2 present, regular rate and rhythm without murmur or gallops. Peripheral pulses 2+ bilaterally.  MSK: Muscle tone and strength appropriate for age. Joints w/o tenderness, redness, or swelling.  EXTREMITIES: Without clubbing, cyanosis, or edema.  NEUROLOGIC: No motor or sensory deficits. Steady, even gait. C2-C12 intact.  PSYCH/MENTAL STATUS: Alert, oriented x  3. Cooperative, appropriate mood and affect.    Health Maintenance Due  Topic Date Due   HIV Screening  Never done   Hepatitis C Screening  Never done   DTaP/Tdap/Td (1 - Tdap) Never done   PAP SMEAR-Modifier  Never done   Colonoscopy  Never done   MAMMOGRAM  Never done   Zoster Vaccines- Shingrix (1 of 2) Never done   COVID-19 Vaccine (1 - 2023-24 season) Never done   INFLUENZA VACCINE  10/06/2022    No results found for any visits on 10/20/22.     Assessment & Plan:  There are no diagnoses linked to this encounter.    Patient to reach out to office if new, worrisome, or unresolved symptoms arise or if no improvement in patient's condition. Patient verbalized understanding and is agreeable to treatment plan. All questions answered to patient's satisfaction.  No follow-ups on file.   Of note, portions of this note may have been created with voice recognition software Physicist, medical). While this note has been edited for accuracy, occasional wrong-word or 'sound-a-like' substitutions may have occurred due to the inherent limitations of voice recognition software.  Yolanda Manges, FNP

## 2022-10-20 ENCOUNTER — Ambulatory Visit (INDEPENDENT_AMBULATORY_CARE_PROVIDER_SITE_OTHER): Payer: 59 | Admitting: Family Medicine

## 2022-10-20 ENCOUNTER — Encounter (HOSPITAL_BASED_OUTPATIENT_CLINIC_OR_DEPARTMENT_OTHER): Payer: Self-pay | Admitting: Family Medicine

## 2022-10-20 VITALS — BP 160/100 | HR 92 | Ht 64.0 in | Wt 241.9 lb

## 2022-10-20 DIAGNOSIS — M25561 Pain in right knee: Secondary | ICD-10-CM

## 2022-10-20 DIAGNOSIS — G8929 Other chronic pain: Secondary | ICD-10-CM | POA: Diagnosis not present

## 2022-10-20 DIAGNOSIS — Z7689 Persons encountering health services in other specified circumstances: Secondary | ICD-10-CM

## 2022-10-20 DIAGNOSIS — I10 Essential (primary) hypertension: Secondary | ICD-10-CM

## 2022-10-20 DIAGNOSIS — M722 Plantar fascial fibromatosis: Secondary | ICD-10-CM | POA: Diagnosis not present

## 2022-10-20 MED ORDER — LOSARTAN POTASSIUM 50 MG PO TABS: ORAL_TABLET | ORAL | 2 refills | Status: DC

## 2022-10-20 NOTE — Patient Instructions (Signed)
Voltaren Gel or generic (diclofenac gel) - please apply to joints as needed for inflammation and pain   Use Compression Stockings/Socks  Get arch support and arch supported shoes  - Walk Fit Arch Supports

## 2022-11-08 ENCOUNTER — Other Ambulatory Visit: Payer: Self-pay

## 2022-11-08 ENCOUNTER — Encounter (HOSPITAL_BASED_OUTPATIENT_CLINIC_OR_DEPARTMENT_OTHER): Payer: Self-pay

## 2022-11-08 ENCOUNTER — Emergency Department (HOSPITAL_BASED_OUTPATIENT_CLINIC_OR_DEPARTMENT_OTHER)
Admission: EM | Admit: 2022-11-08 | Discharge: 2022-11-08 | Discharge status: Against Medical Advice | Payer: 59 | Attending: Emergency Medicine | Admitting: Emergency Medicine

## 2022-11-08 DIAGNOSIS — Z5321 Procedure and treatment not carried out due to patient leaving prior to being seen by health care provider: Secondary | ICD-10-CM | POA: Diagnosis not present

## 2022-11-08 DIAGNOSIS — K0381 Cracked tooth: Secondary | ICD-10-CM | POA: Diagnosis not present

## 2022-11-08 DIAGNOSIS — K0889 Other specified disorders of teeth and supporting structures: Secondary | ICD-10-CM | POA: Diagnosis not present

## 2022-11-08 NOTE — ED Triage Notes (Signed)
Pt arrives ambulatory to ED with c/o upper left dental pain states that she has a cracked tooth and came for antibiotic. Pain ongoing for 4 days. Denies taking anything for pain today.

## 2022-11-10 ENCOUNTER — Telehealth: Payer: 59 | Admitting: Physician Assistant

## 2022-11-10 DIAGNOSIS — K047 Periapical abscess without sinus: Secondary | ICD-10-CM

## 2022-11-10 MED ORDER — CLINDAMYCIN HCL 300 MG PO CAPS: 300.0000 mg | ORAL_CAPSULE | Freq: Three times a day (TID) | ORAL | 0 refills | Status: AC

## 2022-11-10 NOTE — Progress Notes (Signed)
Virtual Visit Consent   Gail Turner, you are scheduled for a virtual visit with a [facility] provider today. Just as with appointments in the office, your consent must be obtained to participate. Your consent will be active for this visit and any virtual visit you may have with one of our providers in the next 365 days. If you have a MyChart account, a copy of this consent can be sent to you electronically.  As this is a virtual visit, video technology does not allow for your provider to perform a traditional examination. This may limit your provider's ability to fully assess your condition. If your provider identifies any concerns that need to be evaluated in person or the need to arrange testing (such as labs, EKG, etc.), we will make arrangements to do so. Although advances in technology are sophisticated, we cannot ensure that it will always work on either your end or our end. If the connection with a video visit is poor, the visit may have to be switched to a telephone visit. With either a video or telephone visit, we are not always able to ensure that we have a secure connection.  By engaging in this virtual visit, you consent to the provision of healthcare and authorize for your insurance to be billed (if applicable) for the services provided during this visit. Depending on your insurance coverage, you may receive a charge related to this service.  I need to obtain your verbal consent now. Are you willing to proceed with your visit today? [name] has provided verbal consent on 11/10/2022 for a virtual visit (video or telephone). Gail Turner, [place]  Date: 11/10/2022 2:29 PM  Virtual Visit via Video Note   I, Gail Turner, connected with  Janet Goelz  ([id number], 03-25-1960) on 11/10/22 at  2:30 PM EDT by a video-enabled telemedicine application and verified that I am speaking with the correct person using two identifiers.  Location: Patient: Virtual Visit  Location Patient: Home Provider: Virtual Visit Location Provider: Home Office   I discussed the limitations of evaluation and management by telemedicine and the availability of in person appointments. The patient expressed understanding and agreed to proceed.    History of Present Illness: Gail Turner is a 62 y.o. who identifies as a female who was assigned female at birth, and is being seen today for pain of upper left tooth for the past 1.5 weeks. Denies purulent drainage but notes gingival swelling. Denies fever, chills. Is currently without dental insurance. OTC -- Tylenol and Ibuprofen, Salt-water gargles.   HPI: HPI  Problems:  Patient Active Problem List   Diagnosis Date Noted   Plantar fasciitis of left foot 10/20/2022   Chronic pain of right knee 10/20/2022   Chronic pain of both shoulders 01/07/2021   Prediabetes 10/29/2020   Carpal tunnel syndrome on right 07/18/2019   Essential hypertension 03/15/2019   Facet arthritis of lumbar region 03/15/2019   Gastroesophageal reflux disease 03/15/2019   Hiatal hernia 03/15/2019   S/P cholecystectomy 03/15/2019   S/P hysterectomy 03/15/2019   Biliary colic 05/20/2018   Cholelithiasis with chronic cholecystitis 05/20/2018    Allergies:  Allergies  Allergen Reactions   Losartan Diarrhea   Amoxicillin Palpitations    Did it involve swelling of the face/tongue/throat, SOB, or low BP? Yes Did it involve sudden or severe rash/hives, skin peeling, or any reaction on the inside of your mouth or nose? No Did you need to seek medical attention at a hospital or doctor's office?  No When did it last happen?      unknown  If all above answers are "NO", may proceed with cephalosporin use.    Medications:  Current Outpatient Medications:    clindamycin (CLEOCIN) 300 MG capsule, Take 1 capsule (300 mg total) by mouth 3 (three) times daily for 7 days., Disp: 21 capsule, Rfl: 0   AMBULATORY NON FORMULARY MEDICATION, daily. Medication  Name: 10 mushroom blend 1 scoop, Disp: , Rfl:    COLLAGEN PO, Take 1 Scoop by mouth daily., Disp: , Rfl:    losartan (COZAAR) 50 MG tablet, Take 1/2 tablet (25mg ) by mouth for the first 14 days, then increase to 1 tablet (50mg )  by mouth daily., Disp: 60 tablet, Rfl: 2   Multiple Vitamin (MULTIVITAMIN ADULT PO), Take 1 Scoop by mouth daily., Disp: , Rfl:    Probiotic Product (PROBIOTIC PO), Take 2 tablets by mouth daily., Disp: , Rfl:   Observations/Objective: Patient is well-developed, well-nourished in no acute distress.  Resting comfortably  at home.  Head is normocephalic, atraumatic.  No labored breathing.  Speech is clear and coherent with logical content.  Patient is alert and oriented at baseline.   Assessment and Plan: 1. Dental infection - clindamycin (CLEOCIN) 300 MG capsule; Take 1 capsule (300 mg total) by mouth 3 (three) times daily for 7 days.  Dispense: 21 capsule; Refill: 0  Supportive measures and OTC medications reviewed. Clindamycin per orders. Follow-up with dental provider as discussed.   Follow Up Instructions: I discussed the assessment and treatment plan with the patient. The patient was provided an opportunity to ask questions and all were answered. The patient agreed with the plan and demonstrated an understanding of the instructions.  A copy of instructions were sent to the patient via MyChart unless otherwise noted below.    The patient was advised to call back or seek an in-person evaluation if the symptoms worsen or if the condition fails to improve as anticipated.  Time:  I spent 10 minutes with the patient via telehealth technology discussing the above problems/concerns.    Gail Climes, PA-C

## 2022-11-10 NOTE — Patient Instructions (Addendum)
Metro Kung, thank you for joining [name], PA-C for today's virtual visit.  While this provider is not your primary care provider (PCP), if your PCP is located in our provider database this encounter information will be shared with them immediately following your visit.   A [facility] MyChart account gives you access to today's visit and all your visits, tests, and labs performed at [facility] " click here if you don't have a [facility] MyChart account or go to [email or web address]  Consent: (Patient) Gail Turner provided verbal consent for this virtual visit at the beginning of the encounter.  Current Medications:  Current Outpatient Medications:    AMBULATORY NON FORMULARY MEDICATION, daily. Medication Name: 10 mushroom blend 1 scoop, Disp: , Rfl:    COLLAGEN PO, Take 1 Scoop by mouth daily., Disp: , Rfl:    losartan (COZAAR) 50 MG tablet, Take 1/2 tablet (25mg ) by mouth for the first 14 days, then increase to 1 tablet (50mg )  by mouth daily., Disp: 60 tablet, Rfl: 2   Multiple Vitamin (MULTIVITAMIN ADULT PO), Take 1 Scoop by mouth daily., Disp: , Rfl:    Probiotic Product (PROBIOTIC PO), Take 2 tablets by mouth daily., Disp: , Rfl:    Medications ordered in this encounter:  No orders of the defined types were placed in this encounter.    *If you need refills on other medications prior to your next appointment, please contact your pharmacy*  Follow-Up: Call back or seek an in-person evaluation if the symptoms worsen or if the condition fails to improve as anticipated.   Virtual Care [phone]  Other Instructions  We are sorry that you are not feeling well.  Here is how we plan to help!  Based on what you have shared with me in the questionnaire, it sounds like you have a dental infection. I have prescribed Clindamycin 300mg  3 times a day for 7 days. You can continue to alternate Tylenol and Ibuprofen.   It is imperative  that you see a dentist within 10 days of this eVisit to determine the cause of the dental pain and be sure it is adequately treated  A toothache or tooth pain is caused when the nerve in the root of a tooth or surrounding a tooth is irritated. Dental (tooth) infection, decay, injury, or loss of a tooth are the most common causes of dental pain. Pain may also occur after an extraction (tooth is pulled out). Pain sometimes originates from other areas and radiates to the jaw, thus appearing to be tooth pain.Bacteria growing inside your mouth can contribute to gum disease and dental decay, both of which can cause pain. A toothache occurs from inflammation of the central portion of the tooth called pulp. The pulp contains nerve endings that are very sensitive to pain. Inflammation to the pulp or pulpitis may be caused by dental cavities, trauma, and infection.    HOME CARE:   For toothaches: Over-the-counter pain medications such as acetaminophen or ibuprofen may be used. Take these as directed on the package while you arrange for a dental appointment. Avoid very cold or hot foods, because they may make the pain worse. You may get relief from biting on a cotton ball soaked in oil of cloves. You can get oil of cloves at most drug stores.  For jaw pain:  Aspirin may be helpful for problems in the joint of the jaw in adults. If pain happens every time you open your mouth widely, the temporomandibular  joint (TMJ) may be the source of the pain. Yawning or taking a large bite of food may worsen the pain. An appointment with your doctor or dentist will help you find the cause.     GET HELP RIGHT AWAY IF:  You have a high fever or chills If you have had a recent head or face injury and develop headache, light headedness, nausea, vomiting, or other symptoms that concern you after an injury to your face or mouth, you could have a more serious injury in addition to your dental injury. A facial rash associated  with a toothache: This condition may improve with medication. Contact your doctor for them to decide what is appropriate. Any jaw pain occurring with chest pain: Although jaw pain is most commonly caused by dental disease, it is sometimes referred pain from other areas. People with heart disease, especially people who have had stents placed, people with diabetes, or those who have had heart surgery may have jaw pain as a symptom of heart attack or angina. If your jaw or tooth pain is associated with lightheadedness, sweating, or shortness of breath, you should see a doctor as soon as possible. Trouble swallowing or excessive pain or bleeding from gums: If you have a history of a weakened immune system, diabetes, or steroid use, you may be more susceptible to infections. Infections can often be more severe and extensive or caused by unusual organisms. Dental and gum infections in people with these conditions may require more aggressive treatment. An abscess may need draining or IV antibiotics, for example.  MAKE SURE YOU   Understand these instructions. Will watch your condition. Will get help right away if you are not doing well or get worse.    If you have been instructed to have an in-person evaluation today at a local Urgent Care facility, please use the link below. It will take you to a list of all of our available [facility], including address, phone number and hours of operation. Please do not delay care.  [facility]  If you or a family member do not have a primary care provider, use the link below to schedule a visit and establish care. When you choose a [facility] primary care physician or advanced practice provider, you gain a long-term partner in health. Find a Primary Care Provider  Learn more about [facility] in-office and virtual care options:  - Get Care Now

## 2022-11-16 NOTE — Progress Notes (Unsigned)
Subjective:   Gail Turner [birth year]  11/17/2022   CC: No chief complaint on file.   HPI: Gail Turner is a 62 y.o. female who presents for a routine health maintenance exam.  Labs collected at time of visit.   HYPERTENSION: Gail Turner presents for the medical management of hypertension.  Patient's current hypertension medication regimen is: *** Patient is *** currently taking prescribed medications for HTN.  Patient is *** regularly keeping a check on BP at home.  Adhering to low sodium diet: *** Exercising Regularly: *** Denies headache, dizziness, CP, SHOB, vision changes.   BP Readings from Last 3 Encounters:  11/08/22 (!) 172/102  10/20/22 (!) 160/100  09/19/22 (!) 146/86      HEALTH SCREENINGS: - Vision Screening: {Blank single:19197::"pap done","not applicable","up to date","done elsewhere"} - Dental Visits: {Blank single:19197::"pap done","not applicable","up to date","done elsewhere"} - Pap smear: {Blank single:19197::"pap done","not applicable","up to date","done elsewhere"} - Breast Exam: {Blank single:19197::"pap done","not applicable","up to date","done elsewhere"} - STD Screening: {Blank single:19197::"Up to date","Ordered today","Not applicable","Declined","Done elsewhere"} - Mammogram (40+): {Blank single:19197::"Up to date","Ordered today","Not applicable","Refused","Done elsewhere"}  - Colonoscopy (45+): {Blank single:19197::"Up to date","Ordered today","Not applicable","Refused","Done elsewhere"}  - Bone Density (65+ or under 65 with predisposing conditions): {Blank single:19197::"Up to date","Ordered today","Not applicable","Refused","Done elsewhere"}  - Lung CA screening with low-dose CT:  {Blank single:19197::"Up to date","Ordered today","Not applicable","Declined","Done elsewhere"} Adults age 58-80 who are current cigarette smokers or quit within the last 15 years. Must have 20 pack year history.   Depression and Anxiety Screen done  today and results listed below:      No data to display             No data to display          IMMUNIZATIONS: - Tdap: Tetanus vaccination status reviewed: {tetanus status:315746}. - HPV: {Blank single:19197::"Up to date","Administered today","Not applicable","Refused","Given elsewhere"} - Influenza: {Blank single:19197::"Up to date","Administered today","Postponed to flu season","Refused","Given elsewhere"} - Pneumovax: {Blank single:19197::"Up to date","Administered today","Not applicable","Refused","Given elsewhere"} - Prevnar 20: {Blank single:19197::"Up to date","Administered today","Not applicable","Refused","Given elsewhere"} - Zostavax (50+): {Blank single:19197::"Up to date","Administered today","Not applicable","Refused","Given elsewhere"}   Past medical history, surgical history, medications, allergies, family history and social history reviewed with patient today and changes made to appropriate areas of the chart.   Past Medical History:  Diagnosis Date   Allergy 2017   Anemia    Arthritis 2021   Gallstones    Generalized abdominal pain 10/28/2020   Last Assessment & Plan:    Continued generalized abdominal pain.  Patient reports intermittent symptoms.  Stools are normal.  No evidence of blood, weight loss, B symptoms.  Colonoscopy was normal in February 2021 with 10-year recall.  Recommendation was made for EGD.  Patient did not follow-up with GI specialist as recommended.  Would like referral to new GI team as previous GI provider has left   GERD (gastroesophageal reflux disease)    Hiatal hernia    Hypertension    IBS (irritable bowel syndrome)     Past Surgical History:  Procedure Laterality Date   ABDOMINAL HYSTERECTOMY     CARDIAC ELECTROPHYSIOLOGY STUDY AND ABLATION     CESAREAN SECTION     x 2   CHOLECYSTECTOMY N/A 05/20/2018   Procedure: LAPAROSCOPIC CHOLECYSTECTOMY WITH INTRAOPERATIVE CHOLANGIOGRAM;  Surgeon: Darnell Level, MD;  Location: [facility];   Service: General;  Laterality: N/A;   TUBAL LIGATION      Current Outpatient Medications on File Prior to Visit  Medication Sig   AMBULATORY NON FORMULARY MEDICATION daily.  Medication Name: 10 mushroom blend 1 scoop   clindamycin (CLEOCIN) 300 MG capsule Take 1 capsule (300 mg total) by mouth 3 (three) times daily for 7 days.   COLLAGEN PO Take 1 Scoop by mouth daily.   losartan (COZAAR) 50 MG tablet Take 1/2 tablet (25mg ) by mouth for the first 14 days, then increase to 1 tablet (50mg )  by mouth daily.   Multiple Vitamin (MULTIVITAMIN ADULT PO) Take 1 Scoop by mouth daily.   Probiotic Product (PROBIOTIC PO) Take 2 tablets by mouth daily.   No current facility-administered medications on file prior to visit.    Allergies  Allergen Reactions   Losartan Diarrhea   Amoxicillin Palpitations    Did it involve swelling of the face/tongue/throat, SOB, or low BP? Yes Did it involve sudden or severe rash/hives, skin peeling, or any reaction on the inside of your mouth or nose? No Did you need to seek medical attention at a hospital or doctor's office? No When did it last happen?      unknown  If all above answers are "NO", may proceed with cephalosporin use.      Social History   Socioeconomic History   Marital status: Legally Separated    Spouse name: Not on file   Number of children: Not on file   Years of education: Not on file   Highest education level: Some college, no degree  Occupational History   Not on file  Tobacco Use   Smoking status: Former    Current packs/day: 0.00    Types: Cigarettes, Cigars   Smokeless tobacco: Never  Vaping Use   Vaping status: Never Used  Substance and Sexual Activity   Alcohol use: Not Currently    Alcohol/week: 2.0 standard drinks of alcohol    Types: 1 Cans of beer, 1 Shots of liquor per week    Comment: 4-5 drinks weekly   Drug use: No   Sexual activity: Not Currently    Birth control/protection: None  Other Topics Concern   Not  on file  Social History Narrative   ** Merged History Encounter **       Social Determinants of Health   Financial Resource Strain: Medium Risk (11/16/2022)   Overall Financial Resource Strain (CARDIA)    Difficulty of Paying Living Expenses: Somewhat hard  Food Insecurity: No Food Insecurity (11/16/2022)   Hunger Vital Sign    Worried About Running Out of Food in the Last Year: Never true    Ran Out of Food in the Last Year: Never true  Transportation Needs: No Transportation Needs (11/16/2022)   PRAPARE - Administrator, Civil Service (Medical): No    Lack of Transportation (Non-Medical): No  Physical Activity: Sufficiently Active (11/16/2022)   Exercise Vital Sign    Days of Exercise per Week: 4 days    Minutes of Exercise per Session: 40 min  Stress: No Stress Concern Present (11/16/2022)   [employer] of Occupational Health - Occupational Stress Questionnaire    Feeling of Stress : Only a little  Social Connections: Unknown (11/16/2022)   Social Connection and Isolation Panel [NHANES]    Frequency of Communication with Friends and Family: Three times a week    Frequency of Social Gatherings with Friends and Family: Patient declined    Attends Religious Services: Patient declined    Active Member of Clubs or Organizations: No    Attends Engineer, structural: Not on file    Marital Status: Divorced  Intimate Partner Violence: Unknown (06/09/2021)   [facility]   HITS    Physically Hurt: Not on file    Insult or Talk Down To: Not on file    Threaten Physical Harm: Not on file    Scream or Curse: Not on file   Social History   Tobacco Use  Smoking Status Former   Current packs/day: 0.00   Types: Cigarettes, Cigars  Smokeless Tobacco Never   Social History   Substance and Sexual Activity  Alcohol Use Not Currently   Alcohol/week: 2.0 standard drinks of alcohol   Types: 1 Cans of beer, 1 Shots of liquor per week    Comment: 4-5 drinks weekly    Family History  Problem Relation Age of Onset   Breast cancer Mother    Arthritis Mother    Ovarian cancer Sister    Brain cancer Maternal Aunt    Colon cancer Neg Hx    Esophageal cancer Neg Hx    Stomach cancer Neg Hx    Pancreatic cancer Neg Hx    Liver disease Neg Hx      ROS: Denies fever, fatigue, unexplained weight loss/gain, chest pain, SHOB, and palpitations. Denies neurological deficits, gastrointestinal or genitourinary complaints, and skin changes.   Objective:   There were no vitals filed for this visit.  GENERAL APPEARANCE: Well-appearing, in NAD. Well nourished.  SKIN: Pink, warm and dry. Turgor normal. No rash, lesion, ulceration, or ecchymoses. Hair evenly distributed.  HEENT: HEAD: Normocephalic.  EYES: PERRLA. EOMI. Lids intact w/o defect. Sclera white, Conjunctiva pink w/o exudate.  EARS: External ear w/o redness, swelling, masses or lesions. EAC clear. TM's intact, translucent w/o bulging, appropriate landmarks visualized. Appropriate acuity to conversational tones.  NOSE: Septum midline w/o deformity. Nares patent, mucosa pink and non-inflamed w/o drainage. No sinus tenderness.  THROAT: Uvula midline. Oropharynx clear. Tonsils non-inflamed w/o exudate. Oral mucosa pink and moist.  NECK: Supple, Trachea midline. Full ROM w/o pain or tenderness. No lymphadenopathy. Thyroid non-tender w/o enlargement or palpable masses.  BREASTS: Breasts pendulous, symmetrical, and w/o palpable masses. Nipples everted and w/o discharge. No rash or skin retraction. No axillary or supraclavicular lymphadenopathy.  RESPIRATORY: Chest wall symmetrical w/o masses. Respirations even and non-labored. Breath sounds clear to auscultation bilaterally. No wheezes, rales, rhonchi, or crackles. CARDIAC: S1, S2 present, regular rate and rhythm. No gallops, murmurs, rubs, or clicks. PMI w/o lifts, heaves, or thrills. No carotid bruits. Capillary refill <2 seconds.  Peripheral pulses 2+ bilaterally. GI: Abdomen soft w/o distention. Normoactive bowel sounds. No palpable masses or tenderness. No guarding or rebound tenderness. Liver and spleen w/o tenderness or enlargement. No CVA tenderness.  GU: External genitalia without erythema, lesions, or masses. No lymphadenopathy. Vaginal mucosa pink and moist without exudate, lesions, or ulcerations. Cervix pink without discharge. Cervical os closed. Uterus and adnexae palpable, not enlarged, and w/o tenderness. No palpable masses.  MSK: Muscle tone and strength appropriate for age, w/o atrophy or abnormal movement.  EXTREMITIES: Active ROM intact, w/o tenderness, crepitus, or contracture. No obvious joint deformities or effusions. No clubbing, edema, or cyanosis.  NEUROLOGIC: CN's II-XII intact. Motor strength symmetrical with no obvious weakness. No sensory deficits. DTR's 2+ symmetric bilaterally. Steady, even gait.  PSYCH/MENTAL STATUS: Alert, oriented x 3. Cooperative, appropriate mood and affect.   Chaperoned by Cristy Hilts, CMA   Results for orders placed or performed during the hospital encounter of 06/30/22  Lipase, blood  Result Value Ref Range   Lipase 28  11 - 51 U/L  Comprehensive metabolic panel  Result Value Ref Range   Sodium 140 135 - 145 mmol/L   Potassium 3.7 3.5 - 5.1 mmol/L   Chloride 107 98 - 111 mmol/L   CO2 25 22 - 32 mmol/L   Glucose, Bld 111 (H) 70 - 99 mg/dL   BUN 17 6 - 20 mg/dL   Creatinine, Ser 1.19 0.44 - 1.00 mg/dL   Calcium 9.2 8.9 - 14.7 mg/dL   Total Protein 7.1 6.5 - 8.1 g/dL   Albumin 4.2 3.5 - 5.0 g/dL   AST 20 15 - 41 U/L   ALT 18 0 - 44 U/L   Alkaline Phosphatase 69 38 - 126 U/L   Total Bilirubin 0.4 0.3 - 1.2 mg/dL   GFR, Estimated >82 >95 mL/min   Anion gap 8 5 - 15  CBC  Result Value Ref Range   WBC 8.4 4.0 - 10.5 K/uL   RBC 4.62 3.87 - 5.11 MIL/uL   Hemoglobin 14.3 12.0 - 15.0 g/dL   HCT 62.1 30.8 - 65.7 %   MCV 92.4 80.0 - 100.0 fL   MCH 31.0 26.0 - 34.0  pg   MCHC 33.5 30.0 - 36.0 g/dL   RDW 84.6 96.2 - 95.2 %   Platelets 299 150 - 400 K/uL   nRBC 0.0 0.0 - 0.2 %  Urinalysis, Routine w reflex microscopic -Urine, Clean Catch  Result Value Ref Range   Color, Urine YELLOW YELLOW   APPearance CLEAR CLEAR   Specific Gravity, Urine 1.022 1.005 - 1.030   pH 6.0 5.0 - 8.0   Glucose, UA NEGATIVE NEGATIVE mg/dL   Hgb urine dipstick NEGATIVE NEGATIVE   Bilirubin Urine NEGATIVE NEGATIVE   Ketones, ur NEGATIVE NEGATIVE mg/dL   Protein, ur NEGATIVE NEGATIVE mg/dL   Nitrite NEGATIVE NEGATIVE   Leukocytes,Ua NEGATIVE NEGATIVE   RBC / HPF 0-5 0 - 5 RBC/hpf   WBC, UA 0-5 0 - 5 WBC/hpf   Bacteria, UA NONE SEEN NONE SEEN   Squamous Epithelial / HPF 0-5 0 - 5 /HPF   Mucus PRESENT     Assessment & Plan:  ***  No orders of the defined types were placed in this encounter.   PATIENT COUNSELING:  - Encouraged a healthy well-balanced diet. Patient may adjust caloric intake to maintain or achieve ideal body weight. May reduce intake of dietary saturated fat and total fat and have adequate dietary potassium and calcium preferably from fresh fruits, vegetables, and low-fat dairy products.   - Advised to avoid cigarette smoking. - Discussed with the patient that most people either abstain from alcohol or drink within safe limits (<=14/week and <=4 drinks/occasion for males, <=7/weeks and <= 3 drinks/occasion for females) and that the risk for alcohol disorders and other health effects rises proportionally with the number of drinks per week and how often a drinker exceeds daily limits. - Discussed cessation/primary prevention of drug use and availability of treatment for abuse.  - Discussed sexually transmitted diseases, avoidance of unintended pregnancy and contraceptive alternatives.  - Stressed the importance of regular exercise - Injury prevention: Discussed safety belts, safety helmets, smoke detector, smoking near bedding or upholstery.  - Dental  health: Discussed importance of regular tooth brushing, flossing, and dental visits.   NEXT PREVENTATIVE PHYSICAL DUE IN 1 YEAR.  No follow-ups on file.  Patient to reach out to office if new, worrisome, or unresolved symptoms arise or if no improvement in patient's condition. Patient verbalized understanding and  is agreeable to treatment plan. All questions answered to patient's satisfaction.   Of note, portions of this note may have been created with voice recognition software Physicist, medical). While this note has been edited for accuracy, occasional wrong-word or 'sound-a-like' substitutions may have occurred due to the inherent limitations of voice recognition software.  Yolanda Manges, FNP

## 2022-11-17 ENCOUNTER — Ambulatory Visit (INDEPENDENT_AMBULATORY_CARE_PROVIDER_SITE_OTHER): Payer: 59 | Admitting: Family Medicine

## 2022-11-17 ENCOUNTER — Encounter (HOSPITAL_BASED_OUTPATIENT_CLINIC_OR_DEPARTMENT_OTHER): Payer: Self-pay | Admitting: Family Medicine

## 2022-11-17 VITALS — BP 138/98 | HR 87 | Ht 64.0 in | Wt 242.0 lb

## 2022-11-17 DIAGNOSIS — I10 Essential (primary) hypertension: Secondary | ICD-10-CM | POA: Diagnosis not present

## 2022-11-17 DIAGNOSIS — Z0000 Encounter for general adult medical examination without abnormal findings: Secondary | ICD-10-CM

## 2022-11-17 DIAGNOSIS — Z1231 Encounter for screening mammogram for malignant neoplasm of breast: Secondary | ICD-10-CM

## 2022-11-17 NOTE — Patient Instructions (Signed)
Increase your Losartan to 1 whole tablet. If blood pressure is not under 140/80 within 2-3 weeks, please call our office.   Consider getting your Shingrix vaccines for shingles prevention.

## 2022-11-18 LAB — CBC WITH DIFFERENTIAL/PLATELET
Basophils Absolute: 0.1 10*3/uL (ref 0.0–0.2)
Basos: 1 %
EOS (ABSOLUTE): 0.2 10*3/uL (ref 0.0–0.4)
Eos: 3 %
Hematocrit: 42.2 % (ref 34.0–46.6)
Hemoglobin: 14 g/dL (ref 11.1–15.9)
Immature Grans (Abs): 0 10*3/uL (ref 0.0–0.1)
Immature Granulocytes: 0 %
Lymphocytes Absolute: 2.7 10*3/uL (ref 0.7–3.1)
Lymphs: 33 %
MCH: 31 pg (ref 26.6–33.0)
MCHC: 33.2 g/dL (ref 31.5–35.7)
MCV: 94 fL (ref 79–97)
Monocytes Absolute: 0.6 10*3/uL (ref 0.1–0.9)
Monocytes: 7 %
Neutrophils Absolute: 4.6 10*3/uL (ref 1.4–7.0)
Neutrophils: 56 %
Platelets: 156 10*3/uL (ref 150–450)
RBC: 4.51 x10E6/uL (ref 3.77–5.28)
RDW: 12.8 % (ref 11.7–15.4)
WBC: 8.2 10*3/uL (ref 3.4–10.8)

## 2022-11-18 LAB — LIPID PANEL
Chol/HDL Ratio: 3.6 ratio (ref 0.0–4.4)
Cholesterol, Total: 191 mg/dL (ref 100–199)
HDL: 53 mg/dL (ref >39)
LDL Chol Calc (NIH): 104 mg/dL — ABNORMAL HIGH (ref 0–99)
Triglycerides: 199 mg/dL — ABNORMAL HIGH (ref 0–149)
VLDL Cholesterol Cal: 34 mg/dL (ref 5–40)

## 2022-11-18 LAB — COMPREHENSIVE METABOLIC PANEL
ALT: 19 IU/L (ref 0–32)
AST: 20 IU/L (ref 0–40)
Albumin: 4 g/dL (ref 3.8–4.9)
Alkaline Phosphatase: 85 IU/L (ref 44–121)
BUN/Creatinine Ratio: 17 (ref 12–28)
BUN: 14 mg/dL (ref 8–27)
Bilirubin Total: 0.4 mg/dL (ref 0.0–1.2)
CO2: 22 mmol/L (ref 20–29)
Calcium: 9.4 mg/dL (ref 8.7–10.3)
Chloride: 105 mmol/L (ref 96–106)
Creatinine, Ser: 0.83 mg/dL (ref 0.57–1.00)
Globulin, Total: 3.1 g/dL (ref 1.5–4.5)
Glucose: 99 mg/dL (ref 70–99)
Potassium: 4.4 mmol/L (ref 3.5–5.2)
Sodium: 141 mmol/L (ref 134–144)
Total Protein: 7.1 g/dL (ref 6.0–8.5)
eGFR: 81 mL/min/{1.73_m2} (ref >59)

## 2022-11-18 LAB — HEMOGLOBIN A1C
Est. average glucose Bld gHb Est-mCnc: 120 mg/dL
Hgb A1c MFr Bld: 5.8 % — ABNORMAL HIGH (ref 4.8–5.6)

## 2022-11-18 LAB — TSH: TSH: 2.53 u[IU]/mL (ref 0.450–4.500)

## 2022-11-21 NOTE — Progress Notes (Signed)
Hi Gail Turner,  Your electrolytes, kidney, and liver function are normal.  Your blood counts are normal as well.  Your thyroid function is normal.  Your triglycerides are slightly elevated and is also reflective with your A1c which is the marker for diabetes.  Your A1c is 5.8 which is in the range of prediabetes.  This can be managed with dietary and lifestyle changes with decreasing sugars, carbohydrates such as pastas, breads, sweets and a recheck in 6 months.  Following a Mediterranean diet is also helpful to lower the triglyceride and the risk for furthering of diabetes.  If you have any questions please let me know.

## 2022-12-21 ENCOUNTER — Ambulatory Visit: Payer: 59 | Admitting: Gastroenterology

## 2022-12-21 ENCOUNTER — Encounter: Payer: Self-pay | Admitting: Gastroenterology

## 2022-12-21 VITALS — BP 134/70 | HR 93 | Ht 63.5 in | Wt 246.0 lb

## 2022-12-21 DIAGNOSIS — R140 Abdominal distension (gaseous): Secondary | ICD-10-CM | POA: Diagnosis not present

## 2022-12-21 DIAGNOSIS — K219 Gastro-esophageal reflux disease without esophagitis: Secondary | ICD-10-CM | POA: Diagnosis not present

## 2022-12-21 DIAGNOSIS — K449 Diaphragmatic hernia without obstruction or gangrene: Secondary | ICD-10-CM

## 2022-12-21 NOTE — Patient Instructions (Signed)
Follow up as needed   _______________________________________________________  If your blood pressure at your visit was 140/90 or greater, please contact your primary care physician to follow up on this.  _______________________________________________________  If you are age 61 or older, your body mass index should be between 23-30. Your Body mass index is 42.89 kg/m. If this is out of the aforementioned range listed, please consider follow up with your Primary Care Provider.  If you are age 1 or younger, your body mass index should be between 19-25. Your Body mass index is 42.89 kg/m. If this is out of the aformentioned range listed, please consider follow up with your Primary Care Provider.   ________________________________________________________  The [facility] providers would like to encourage you to use [facility] to communicate with providers for non-urgent requests or questions.  Due to long hold times on the telephone, sending your provider a message by [facility] may be a faster and more efficient way to get a response.  Please allow 48 business hours for a response.  Please remember that this is for non-urgent requests.  _______________________________________________________   Thank you for entrusting me with your care and choosing [facility].  Bayley Leanna Sato, PA-C

## 2022-12-21 NOTE — Progress Notes (Signed)
Chief Complaint: GERD Primary GI MD: Dr. Meridee Score  HPI:  Discussed the use of AI scribe software for clinical note transcription with the patient, who gave verbal consent to proceed.  History of Present Illness   The patient, with a history of GERD, presents with increased bowel movements and lower back pain.   She attributes these symptoms to a recent increase in dietary fiber from flaxseed, chia seeds, and hibiscus water. The patient has been managing her GERD with baking soda instead of the prescribed Zegerid, as she prefers not to take pills.   She also takes a probiotic, which she ran out of around the same time she increased her fiber intake.   The patient reports that the lower back pain is relieved by passing gas or having a bowel movement, suggesting it may be gas pain. She has noticed more lower gas than upper gas since increasing her fiber intake.   The patient also mentions a recent dental procedure in [place], which did not result in any adverse effects.      PREVIOUS GI WORKUP   EGD 12/2020 for GERD and epigastric pain noted H. pylori negative gastritis.   Colonoscopy 04/2019 for screening, normal, repeat 2031  Past Medical History:  Diagnosis Date   Allergy 2017   Anemia    Arthritis 2021   COVID    Gallstones    Generalized abdominal pain 10/28/2020   Last Assessment & Plan:    Continued generalized abdominal pain.  Patient reports intermittent symptoms.  Stools are normal.  No evidence of blood, weight loss, B symptoms.  Colonoscopy was normal in February 2021 with 10-year recall.  Recommendation was made for EGD.  Patient did not follow-up with GI specialist as recommended.  Would like referral to new GI team as previous GI provider has left   GERD (gastroesophageal reflux disease)    Hiatal hernia    Hypertension    IBS (irritable bowel syndrome)     Past Surgical History:  Procedure Laterality Date   ABDOMINAL HYSTERECTOMY     CARDIAC  ELECTROPHYSIOLOGY STUDY AND ABLATION     CESAREAN SECTION     x 2   CHOLECYSTECTOMY N/A 05/20/2018   Procedure: LAPAROSCOPIC CHOLECYSTECTOMY WITH INTRAOPERATIVE CHOLANGIOGRAM;  Surgeon: Darnell Level, MD;  Location: [facility];  Service: General;  Laterality: N/A;   ROOT CANAL     and tooth extraction   TUBAL LIGATION      Current Outpatient Medications  Medication Sig Dispense Refill   AMBULATORY NON FORMULARY MEDICATION daily. Medication Name: 10 mushroom blend 1 scoop     COLLAGEN PO Take 1 Scoop by mouth daily.     losartan (COZAAR) 50 MG tablet Take 1/2 tablet (25mg ) by mouth for the first 14 days, then increase to 1 tablet (50mg )  by mouth daily. (Patient taking differently: Take 50 mg by mouth daily. Take 1/2 tablet (25mg ) by mouth for the first 14 days, then increase to 1 tablet (50mg )  by mouth daily.) 60 tablet 2   Multiple Vitamin (MULTIVITAMIN ADULT PO) Take 1 Scoop by mouth daily.     Probiotic Product (PROBIOTIC PO) Take 2 tablets by mouth daily.     No current facility-administered medications for this visit.    Allergies as of 12/21/2022 - Review Complete 12/21/2022  Allergen Reaction Noted   Amoxicillin Palpitations 07/15/2017    Family History  Problem Relation Age of Onset   Breast cancer Mother    Arthritis Mother    Other  Father        history unknown   Ovarian cancer Sister    Brain cancer Maternal Aunt    Colon cancer Neg Hx    Esophageal cancer Neg Hx    Stomach cancer Neg Hx    Pancreatic cancer Neg Hx    Liver disease Neg Hx     Social History   Socioeconomic History   Marital status: Divorced    Spouse name: Not on file   Number of children: 2   Years of education: Not on file   Highest education level: Some college, no degree  Occupational History   Not on file  Tobacco Use   Smoking status: Former    Current packs/day: 0.00    Types: Cigarettes, Cigars   Smokeless tobacco: Never  Vaping Use   Vaping status: Never Used  Substance and  Sexual Activity   Alcohol use: Not Currently    Alcohol/week: 2.0 standard drinks of alcohol    Types: 1 Cans of beer, 1 Shots of liquor per week    Comment: 4-5 drinks weekly   Drug use: No   Sexual activity: Not Currently    Birth control/protection: None  Other Topics Concern   Not on file  Social History Narrative   ** Merged History Encounter **       Social Determinants of Health   Financial Resource Strain: Medium Risk (11/16/2022)   Overall Financial Resource Strain (CARDIA)    Difficulty of Paying Living Expenses: Somewhat hard  Food Insecurity: No Food Insecurity (11/16/2022)   Hunger Vital Sign    Worried About Running Out of Food in the Last Year: Never true    Ran Out of Food in the Last Year: Never true  Transportation Needs: No Transportation Needs (11/16/2022)   PRAPARE - Administrator, Civil Service (Medical): No    Lack of Transportation (Non-Medical): No  Physical Activity: Sufficiently Active (11/16/2022)   Exercise Vital Sign    Days of Exercise per Week: 4 days    Minutes of Exercise per Session: 40 min  Stress: No Stress Concern Present (11/16/2022)   [employer] of Occupational Health - Occupational Stress Questionnaire    Feeling of Stress : Only a little  Social Connections: Moderately Isolated (11/17/2022)   Social Connection and Isolation Panel [NHANES]    Frequency of Communication with Friends and Family: More than three times a week    Frequency of Social Gatherings with Friends and Family: Never    Attends Religious Services: Never    Database administrator or Organizations: Yes    Attends Banker Meetings: Never    Marital Status: Divorced  Catering manager Violence: Not At Risk (11/17/2022)   Humiliation, Afraid, Rape, and Kick questionnaire    Fear of Current or Ex-Partner: No    Emotionally Abused: No    Physically Abused: No    Sexually Abused: No    Review of Systems:    Constitutional: No weight loss,  fever, chills, weakness or fatigue HEENT: Eyes: No change in vision               Ears, Nose, Throat:  No change in hearing or congestion Skin: No rash or itching Cardiovascular: No chest pain, chest pressure or palpitations   Respiratory: No SOB or cough Gastrointestinal: See HPI and otherwise negative Genitourinary: No dysuria or change in urinary frequency Neurological: No headache, dizziness or syncope Musculoskeletal: No new muscle or joint pain Hematologic:  No bleeding or bruising Psychiatric: No history of depression or anxiety    Physical Exam:  Vital signs: BP 134/70   Pulse 93   Ht 5' 3.5" (1.613 m)   Wt 111.6 kg   BMI 42.89 kg/m   Constitutional: NAD, Well developed, Well nourished, alert and cooperative Head:  Normocephalic and atraumatic. Eyes:   PEERL, EOMI. No icterus. Conjunctiva pink. Respiratory: Respirations even and unlabored. Lungs clear to auscultation bilaterally.   No wheezes, crackles, or rhonchi.  Cardiovascular:  Regular rate and rhythm. No peripheral edema, cyanosis or pallor.  Gastrointestinal:  Soft, nondistended, nontender. No rebound or guarding. Normal bowel sounds. No appreciable masses or hepatomegaly. Rectal:  Not performed.  Msk:  Symmetrical without gross deformities. Without edema, no deformity or joint abnormality.  Neurologic:  Alert and  oriented x4;  grossly normal neurologically.  Skin:   Dry and intact without significant lesions or rashes. Psychiatric: Oriented to person, place and time. Demonstrates good judgement and reason without abnormal affect or behaviors.   RELEVANT LABS AND IMAGING: CBC    Component Value Date/Time   WBC 8.2 11/17/2022 0907   WBC 8.4 06/30/2022 1401   RBC 4.51 11/17/2022 0907   RBC 4.62 06/30/2022 1401   HGB 14.0 11/17/2022 0907   HCT 42.2 11/17/2022 0907   PLT 156 11/17/2022 0907   MCV 94 11/17/2022 0907   MCH 31.0 11/17/2022 0907   MCH 31.0 06/30/2022 1401   MCHC 33.2 11/17/2022 0907   MCHC  33.5 06/30/2022 1401   RDW 12.8 11/17/2022 0907   LYMPHSABS 2.7 11/17/2022 0907   MONOABS 0.7 01/29/2021 2017   EOSABS 0.2 11/17/2022 0907   BASOSABS 0.1 11/17/2022 0907    CMP     Component Value Date/Time   NA 141 11/17/2022 0907   K 4.4 11/17/2022 0907   CL 105 11/17/2022 0907   CO2 22 11/17/2022 0907   GLUCOSE 99 11/17/2022 0907   GLUCOSE 111 (H) 06/30/2022 1401   BUN 14 11/17/2022 0907   CREATININE 0.83 11/17/2022 0907   CALCIUM 9.4 11/17/2022 0907   PROT 7.1 11/17/2022 0907   ALBUMIN 4.0 11/17/2022 0907   AST 20 11/17/2022 0907   ALT 19 11/17/2022 0907   ALKPHOS 85 11/17/2022 0907   BILITOT 0.4 11/17/2022 0907   GFRNONAA >60 06/30/2022 1401   GFRAA >60 07/05/2018 0930     Assessment/Plan:      Gastroesophageal Reflux Disease (GERD) Patient has been managing symptoms with baking soda instead of prescribed Zegerid. No significant worsening of symptoms reported. History of Hiatal hernia -Continue current management with baking soda as per patient's preference. Discussed this is an "instant fix" and won't heal the source of her symptoms. Educated patient on lifestyle modifications. -Provide patient with diet handouts to help reduce acid reflux.  Increased Fiber Intake Gassiness Recent increase in fiber intake from flaxseed, chia seed, and hibiscus water has led to increased bowel movements and lower back pain, likely due to gas. Patient has since adjusted intake. -Advise gradual increase in fiber intake to allow body to adjust. -Recommend Gas-X for gas-related discomfort.  Colon Cancer Screening Last colonoscopy was in 2021, next due in 2031. -Remind patient of next colonoscopy due date in future appointments.      Lara Mulch [facility] 12/21/2022, 2:51 PM  Cc: Bryon Lions, PA-C

## 2022-12-23 NOTE — Progress Notes (Signed)
Attending Physician's Attestation   I have reviewed the chart.   I agree with the Advanced Practitioner's note, impression, and recommendations with any updates as below. If issues persist in future, diagnostic endoscopy indicated.   Corliss Parish, MD [facility] Advanced Endoscopy Office # [phone]

## 2023-01-04 ENCOUNTER — Telehealth: Payer: Self-pay | Admitting: Gastroenterology

## 2023-01-04 ENCOUNTER — Encounter (HOSPITAL_BASED_OUTPATIENT_CLINIC_OR_DEPARTMENT_OTHER): Payer: Self-pay | Admitting: Family Medicine

## 2023-01-04 DIAGNOSIS — R140 Abdominal distension (gaseous): Secondary | ICD-10-CM

## 2023-01-04 DIAGNOSIS — R1013 Epigastric pain: Secondary | ICD-10-CM

## 2023-01-04 NOTE — Telephone Encounter (Signed)
Inbound call from patient stating that she was still having stomach issues and was wanting to discuss with [facility] about possibly scheduling an EGD. Requesting a call back to discuss. Please advise.

## 2023-01-05 NOTE — Telephone Encounter (Signed)
Patient has been advised to increase zegerid to 40 mg dosing. She is advised to avoid eating before bed/lying down and she states that she already does this. I have also instructed her on low FODMAP diet and she will try to follow this. She has scheduled endoscopy with Dr Meridee Score on 01/10/23 at 230 pm. She has been advised of time/date/location of upcoming procedure and has been given verbal prep instructions. Also advised that she will need a care partner 18 years or older to bring her, stay for the procedure and drive her home due to sedation. Written instructions have also been made available to the patient for additional review.

## 2023-01-05 NOTE — Telephone Encounter (Signed)
Patient calls with complaints of continued generalized abdominal bloating and pain as well as increased gas. States that Gail Turner started Zegerid 1 capsule daily x 1 weeks ago and discontinued all fiber supplementation at that time as well. Despite these changes, patient does not feel any different and describes being miserable with gas pain. States Gail Turner is following a pretty bland diet (ie plain rice and lentils). Wants to know if Gail Turner needs endoscopy or other additional testing for these symptoms.

## 2023-01-05 NOTE — Telephone Encounter (Signed)
===  View-only below this line=== ----- Message ----- From: Legrand Como, PA-C Sent: 01/05/2023  11:26 AM EDT To: Richardson Chiquito, RN  So dried lentils are on high FODMAP which can make gas worse. Canned lentils are better (and better to rinse first). Recommend low FODMAP diet. Can set up EGD if she would like. Would recommend if she is on 20mg  to increase to 40mg  zegrid. Avoid eating soon before bed and soon before lying down soon after eating.

## 2023-01-10 ENCOUNTER — Other Ambulatory Visit: Payer: Self-pay | Admitting: Gastroenterology

## 2023-01-10 ENCOUNTER — Encounter: Payer: Self-pay | Admitting: Gastroenterology

## 2023-01-10 ENCOUNTER — Ambulatory Visit (AMBULATORY_SURGERY_CENTER): Payer: 59 | Admitting: Gastroenterology

## 2023-01-10 VITALS — BP 126/81 | HR 78 | Temp 98.1°F | Resp 15 | Ht 64.0 in | Wt 242.0 lb

## 2023-01-10 DIAGNOSIS — K449 Diaphragmatic hernia without obstruction or gangrene: Secondary | ICD-10-CM | POA: Diagnosis not present

## 2023-01-10 DIAGNOSIS — K219 Gastro-esophageal reflux disease without esophagitis: Secondary | ICD-10-CM

## 2023-01-10 DIAGNOSIS — K2970 Gastritis, unspecified, without bleeding: Secondary | ICD-10-CM

## 2023-01-10 DIAGNOSIS — K229 Disease of esophagus, unspecified: Secondary | ICD-10-CM | POA: Diagnosis not present

## 2023-01-10 DIAGNOSIS — K317 Polyp of stomach and duodenum: Secondary | ICD-10-CM

## 2023-01-10 DIAGNOSIS — I10 Essential (primary) hypertension: Secondary | ICD-10-CM | POA: Diagnosis not present

## 2023-01-10 DIAGNOSIS — E785 Hyperlipidemia, unspecified: Secondary | ICD-10-CM | POA: Diagnosis not present

## 2023-01-10 DIAGNOSIS — R1310 Dysphagia, unspecified: Secondary | ICD-10-CM

## 2023-01-10 DIAGNOSIS — K319 Disease of stomach and duodenum, unspecified: Secondary | ICD-10-CM | POA: Diagnosis not present

## 2023-01-10 MED ORDER — SODIUM CHLORIDE 0.9 % IV SOLN: 500.0000 mL | INTRAVENOUS | Status: DC

## 2023-01-10 MED ORDER — OMEPRAZOLE 20 MG PO CPDR: 20.0000 mg | DELAYED_RELEASE_CAPSULE | Freq: Every day | ORAL | 1 refills | Status: DC

## 2023-01-10 NOTE — Progress Notes (Signed)
Pt's states no medical or surgical changes since previsit or office visit. 

## 2023-01-10 NOTE — Progress Notes (Signed)
Vss nad trans to pacu 

## 2023-01-10 NOTE — Op Note (Signed)
[facility] Patient Name: Gail Turner Procedure Date: 01/10/2023 2:52 PM MRN: [id number] Endoscopist: Corliss Parish , MD, [id number] Age: 62 Referring MD:  Date of Birth: [birth year] Gender: Female Account #: [id number] Procedure:                Upper GI endoscopy Indications:              Epigastric abdominal pain, Dysphagia, Heartburn,                            Abdominal bloating Medicines:                Monitored Anesthesia Care Procedure:                Pre-Anesthesia Assessment:                           - Prior to the procedure, a History and Physical                            was performed, and patient medications and                            allergies were reviewed. The patient's tolerance of                            previous anesthesia was also reviewed. The risks                            and benefits of the procedure and the sedation                            options and risks were discussed with the patient.                            All questions were answered, and informed consent                            was obtained. Prior Anticoagulants: The patient has                            taken no anticoagulant or antiplatelet agents. ASA                            Grade Assessment: III - A patient with severe                            systemic disease. After reviewing the risks and                            benefits, the patient was deemed in satisfactory                            condition to undergo the procedure.  After obtaining informed consent, the endoscope was                            passed under direct vision. Throughout the                            procedure, the patient's blood pressure, pulse, and                            oxygen saturations were monitored continuously. The                            Olympus Scope [id number] was introduced through the                            mouth, and advanced to  the second part of duodenum.                            The upper GI endoscopy was accomplished without                            difficulty. The patient tolerated the procedure. Scope In: Scope Out: Findings:                 No gross lesions were noted in the entire                            esophagus. Biopsies were taken with a cold forceps                            for histology. A guidewire was placed and the scope                            was withdrawn. Dilation was performed with a Savary                            dilator with mild resistance at 18 mm. The dilation                            site was examined following endoscope reinsertion                            and showed at the GE Jxn a mild mucosal disruption,                            mild improvement in luminal narrowing and no                            perforation.                           The Z-line was irregular and was found 33 cm from  the incisors.                           A 2 cm hiatal hernia was present.                           A few small semi-sessile polyps were found in the                            gastric body (fundic gland in appearance).                           Localized mild inflammation characterized by                            erosions and erythema was found in the gastric                            antrum.                           No other gross lesions were noted in the entire                            examined stomach. Biopsies were taken with a cold                            forceps for histology and Helicobacter pylori                            testing.                           No gross lesions were noted in the duodenal bulb,                            in the first portion of the duodenum and in the                            second portion of the duodenum. Biopsies were taken                            with a cold forceps for  histology. Complications:            No immediate complications. Estimated Blood Loss:     Estimated blood loss was minimal. Impression:               - No gross lesions in the entire esophagus.                            Biopsied. Dilated to 18 mm Savary with mild mucosal                            wrent at GE Jxn.                           -  Z-line irregular, 33 cm from the incisors.                           - 2 cm hiatal hernia.                           - A few gastric polyps (fundic gland).                           - Erosive gastritis in antrum. No other gross                            lesions in the entire stomach. Biopsied.                           - No gross lesions in the duodenal bulb, in the                            first portion of the duodenum and in the second                            portion of the duodenum. Biopsied. Recommendation:           - The patient will be observed post-procedure,                            until all discharge criteria are met.                           - Discharge patient to home.                           - Patient has a contact number available for                            emergencies. The signs and symptoms of potential                            delayed complications were discussed with the                            patient. Return to normal activities tomorrow.                            Written discharge instructions were provided to the                            patient.                           - Await pathology results.                           - Recommend increasing PPI dosing. She is already  on Zegerid, and if she feels the Sodium Bicarbonate                            is helpful she can continue this and add an extra                            Omeprazole 20 mg once daily in the evening as a 2nd                            dose for 41-months and then back down to once daily.                            - Continue present medications otherwise.                           - If dysphagia symptoms improve and then recur                            consider repeat dilation in future. If no                            improvement, could consider Manometry.                           - Consider SIBO breath testing and fecal elastase                            testing for further evaluation/treatment of                            bloating.                           - Consider Phazyme Extra Strength vs Gas-X Extra                            Strength for bloating/gas symptoms (OTC).                           - The findings and recommendations were discussed                            with the patient.                           - The findings and recommendations were discussed                            with the patient's family. Corliss Parish, MD 01/10/2023 3:16:09 PM

## 2023-01-10 NOTE — Progress Notes (Signed)
GASTROENTEROLOGY PROCEDURE H&P NOTE   Primary Care Physician: Hilbert Bible, FNP  HPI: Gail Turner is a 62 y.o. female who presents for EGD for evaluation of GERD and bloating.  Past Medical History:  Diagnosis Date   Allergy 2017   Anemia    Arthritis 2021   COVID    Gallstones    Generalized abdominal pain 10/28/2020   Last Assessment & Plan:    Continued generalized abdominal pain.  Patient reports intermittent symptoms.  Stools are normal.  No evidence of blood, weight loss, B symptoms.  Colonoscopy was normal in February 2021 with 10-year recall.  Recommendation was made for EGD.  Patient did not follow-up with GI specialist as recommended.  Would like referral to new GI team as previous GI provider has left   GERD (gastroesophageal reflux disease)    Hiatal hernia    Hypertension    IBS (irritable bowel syndrome)    Past Surgical History:  Procedure Laterality Date   ABDOMINAL HYSTERECTOMY     CARDIAC ELECTROPHYSIOLOGY STUDY AND ABLATION     CESAREAN SECTION     x 2   CHOLECYSTECTOMY N/A 05/20/2018   Procedure: LAPAROSCOPIC CHOLECYSTECTOMY WITH INTRAOPERATIVE CHOLANGIOGRAM;  Surgeon: Darnell Level, MD;  Location: [facility];  Service: General;  Laterality: N/A;   ROOT CANAL     and tooth extraction   TUBAL LIGATION     Current Outpatient Medications  Medication Sig Dispense Refill   COLLAGEN PO Take 1 Scoop by mouth daily.     losartan (COZAAR) 50 MG tablet Take 1/2 tablet (25mg ) by mouth for the first 14 days, then increase to 1 tablet (50mg )  by mouth daily. (Patient taking differently: Take 50 mg by mouth daily. Take 1/2 tablet (25mg ) by mouth for the first 14 days, then increase to 1 tablet (50mg )  by mouth daily.) 60 tablet 2   Multiple Vitamin (MULTIVITAMIN ADULT PO) Take 1 Scoop by mouth daily.     Omeprazole-Sodium Bicarbonate (ZEGERID) 20-1100 MG CAPS capsule Take 1 capsule by mouth daily before breakfast.     Probiotic Product (PROBIOTIC PO) Take 2  tablets by mouth daily.     AMBULATORY NON FORMULARY MEDICATION daily. Medication Name: 10 mushroom blend 1 scoop     Current Facility-Administered Medications  Medication Dose Route Frequency Provider Last Rate Last Admin   0.9 %  sodium chloride infusion  500 mL Intravenous Continuous Mansouraty, Netty Starring., MD        Current Outpatient Medications:    COLLAGEN PO, Take 1 Scoop by mouth daily., Disp: , Rfl:    losartan (COZAAR) 50 MG tablet, Take 1/2 tablet (25mg ) by mouth for the first 14 days, then increase to 1 tablet (50mg )  by mouth daily. (Patient taking differently: Take 50 mg by mouth daily. Take 1/2 tablet (25mg ) by mouth for the first 14 days, then increase to 1 tablet (50mg )  by mouth daily.), Disp: 60 tablet, Rfl: 2   Multiple Vitamin (MULTIVITAMIN ADULT PO), Take 1 Scoop by mouth daily., Disp: , Rfl:    Omeprazole-Sodium Bicarbonate (ZEGERID) 20-1100 MG CAPS capsule, Take 1 capsule by mouth daily before breakfast., Disp: , Rfl:    Probiotic Product (PROBIOTIC PO), Take 2 tablets by mouth daily., Disp: , Rfl:    AMBULATORY NON FORMULARY MEDICATION, daily. Medication Name: 10 mushroom blend 1 scoop, Disp: , Rfl:   Current Facility-Administered Medications:    0.9 %  sodium chloride infusion, 500 mL, Intravenous, Continuous, Mansouraty, Netty Starring., MD  Allergies  Allergen Reactions   Amoxicillin Palpitations    Did it involve swelling of the face/tongue/throat, SOB, or low BP? Yes Did it involve sudden or severe rash/hives, skin peeling, or any reaction on the inside of your mouth or nose? No Did you need to seek medical attention at a hospital or doctor's office? No When did it last happen?      unknown  If all above answers are "NO", may proceed with cephalosporin use.    Family History  Problem Relation Age of Onset   Breast cancer Mother    Arthritis Mother    Other Father        history unknown   Ovarian cancer Sister    Brain cancer Maternal Aunt    Colon cancer  Neg Hx    Esophageal cancer Neg Hx    Stomach cancer Neg Hx    Pancreatic cancer Neg Hx    Liver disease Neg Hx    Social History   Socioeconomic History   Marital status: Divorced    Spouse name: Not on file   Number of children: 2   Years of education: Not on file   Highest education level: Some college, no degree  Occupational History   Not on file  Tobacco Use   Smoking status: Former    Types: Cigarettes, Cigars   Smokeless tobacco: Never  Vaping Use   Vaping status: Never Used  Substance and Sexual Activity   Alcohol use: Not Currently    Alcohol/week: 2.0 standard drinks of alcohol    Types: 1 Cans of beer, 1 Shots of liquor per week    Comment: 4-5 drinks weekly   Drug use: No   Sexual activity: Not Currently    Birth control/protection: None  Other Topics Concern   Not on file  Social History Narrative   ** Merged History Encounter **       Social Determinants of Health   Financial Resource Strain: Low Risk  (01/09/2023)   Overall Financial Resource Strain (CARDIA)    Difficulty of Paying Living Expenses: Not hard at all  Recent Concern: Financial Resource Strain - Medium Risk (11/16/2022)   Overall Financial Resource Strain (CARDIA)    Difficulty of Paying Living Expenses: Somewhat hard  Food Insecurity: No Food Insecurity (01/09/2023)   Hunger Vital Sign    Worried About Running Out of Food in the Last Year: Never true    Ran Out of Food in the Last Year: Never true  Transportation Needs: No Transportation Needs (01/09/2023)   PRAPARE - Administrator, Civil Service (Medical): No    Lack of Transportation (Non-Medical): No  Physical Activity: Unknown (01/09/2023)   Exercise Vital Sign    Days of Exercise per Week: Patient declined    Minutes of Exercise per Session: 40 min  Stress: No Stress Concern Present (01/09/2023)   [employer] of Occupational Health - Occupational Stress Questionnaire    Feeling of Stress : Not at all  Social  Connections: Unknown (01/09/2023)   Social Connection and Isolation Panel [NHANES]    Frequency of Communication with Friends and Family: More than three times a week    Frequency of Social Gatherings with Friends and Family: Once a week    Attends Religious Services: Patient declined    Database administrator or Organizations: No    Attends Banker Meetings: Never    Marital Status: Divorced  Recent Concern: Social Connections - Moderately Isolated (11/17/2022)  Social Advertising account executive [NHANES]    Frequency of Communication with Friends and Family: More than three times a week    Frequency of Social Gatherings with Friends and Family: Never    Attends Religious Services: Never    Database administrator or Organizations: Yes    Attends Banker Meetings: Never    Marital Status: Divorced  Catering manager Violence: Not At Risk (11/17/2022)   Humiliation, Afraid, Rape, and Kick questionnaire    Fear of Current or Ex-Partner: No    Emotionally Abused: No    Physically Abused: No    Sexually Abused: No    Physical Exam: Today's Vitals   01/10/23 1412 01/10/23 1416  BP: (!) 153/86   Pulse: 77   Temp: 98.1 F (36.7 C) 98.1 F (36.7 C)  SpO2: 96%   Weight: 242 lb (109.8 kg)   Height: 5\' 4"  (1.626 m)    Body mass index is 41.54 kg/m. GEN: NAD EYE: Sclerae anicteric ENT: MMM CV: Non-tachycardic GI: Soft, NT/ND NEURO:  Alert & Oriented x 3  Lab Results: No results for input(s): "WBC", "HGB", "HCT", "PLT" in the last 72 hours. BMET No results for input(s): "NA", "K", "CL", "CO2", "GLUCOSE", "BUN", "CREATININE", "CALCIUM" in the last 72 hours. LFT No results for input(s): "PROT", "ALBUMIN", "AST", "ALT", "ALKPHOS", "BILITOT", "BILIDIR", "IBILI" in the last 72 hours. PT/INR No results for input(s): "LABPROT", "INR" in the last 72 hours.   Impression / Plan: This is a 62 y.o.female who presents for EGD for evaluation of GERD and  bloating.  The risks and benefits of endoscopic evaluation/treatment were discussed with the patient and/or family; these include but are not limited to the risk of perforation, infection, bleeding, missed lesions, lack of diagnosis, severe illness requiring hospitalization, as well as anesthesia and sedation related illnesses.  The patient's history has been reviewed, patient examined, no change in status, and deemed stable for procedure.  The patient and/or family is agreeable to proceed.    Corliss Parish, MD [facility] Advanced [facility]oscopy Office # [phone]

## 2023-01-10 NOTE — Progress Notes (Signed)
Called to room to assist during endoscopic procedure.  Patient ID and intended procedure confirmed with present staff. Received instructions for my participation in the procedure from the performing physician.  

## 2023-01-10 NOTE — Patient Instructions (Addendum)
Handouts Provided:  Hiatal Hernia and Post Dilation Diet  START taking omeprazole 20 mg once daily in the evening for two months then stop.   Consider Phazyme Extra Strength vs Gas-X Extra strength for bloating/gas symptoms--these are both over the counter.   YOU HAD AN ENDOSCOPIC PROCEDURE TODAY AT THE [facility]:   Refer to the procedure report that was given to you for any specific questions about what was found during the examination.  If the procedure report does not answer your questions, please call your gastroenterologist to clarify.  If you requested that your care partner not be given the details of your procedure findings, then the procedure report has been included in a sealed envelope for you to review at your convenience later.  YOU SHOULD EXPECT: Some feelings of bloating in the abdomen. Passage of more gas than usual.  Walking can help get rid of the air that was put into your GI tract during the procedure and reduce the bloating. If you had a lower endoscopy (such as a colonoscopy or flexible sigmoidoscopy) you may notice spotting of blood in your stool or on the toilet paper. If you underwent a bowel prep for your procedure, you may not have a normal bowel movement for a few days.  Please Note:  You might notice some irritation and congestion in your nose or some drainage.  This is from the oxygen used during your procedure.  There is no need for concern and it should clear up in a day or so.  SYMPTOMS TO REPORT IMMEDIATELY:  Following upper endoscopy (EGD)  Vomiting of blood or coffee ground material  New chest pain or pain under the shoulder blades  Painful or persistently difficult swallowing  New shortness of breath  Fever of 100F or higher  Black, tarry-looking stools  For urgent or emergent issues, a gastroenterologist can be reached at any hour by calling [phone]. Do not use MyChart messaging for urgent concerns.    DIET:  See Post Dilation  Diet provided.  Drink plenty of fluids but you should avoid alcoholic beverages for 24 hours.  ACTIVITY:  You should plan to take it easy for the rest of today and you should NOT DRIVE or use heavy machinery until tomorrow (because of the sedation medicines used during the test).    FOLLOW UP: Our staff will call the number listed on your records the next business day following your procedure.  We will call around 7:15- 8:00 am to check on you and address any questions or concerns that you may have regarding the information given to you following your procedure. If we do not reach you, we will leave a message.     If any biopsies were taken you will be contacted by phone or by letter within the next 1-3 weeks.  Please call us at [phone] if you have not heard about the biopsies in 3 weeks.    SIGNATURES/CONFIDENTIALITY: You and/or your care partner have signed paperwork which will be entered into your electronic medical record.  These signatures attest to the fact that that the information above on your After Visit Summary has been reviewed and is understood.  Full responsibility of the confidentiality of this discharge information lies with you and/or your care-partner.

## 2023-01-11 ENCOUNTER — Encounter (HOSPITAL_BASED_OUTPATIENT_CLINIC_OR_DEPARTMENT_OTHER): Payer: Self-pay | Admitting: Family Medicine

## 2023-01-11 ENCOUNTER — Ambulatory Visit (INDEPENDENT_AMBULATORY_CARE_PROVIDER_SITE_OTHER): Payer: 59

## 2023-01-11 ENCOUNTER — Telehealth: Payer: Self-pay | Admitting: Gastroenterology

## 2023-01-11 ENCOUNTER — Ambulatory Visit (HOSPITAL_BASED_OUTPATIENT_CLINIC_OR_DEPARTMENT_OTHER): Payer: 59

## 2023-01-11 ENCOUNTER — Ambulatory Visit (INDEPENDENT_AMBULATORY_CARE_PROVIDER_SITE_OTHER): Payer: 59 | Admitting: Family Medicine

## 2023-01-11 ENCOUNTER — Telehealth: Payer: Self-pay

## 2023-01-11 VITALS — BP 160/96 | HR 79 | Ht 64.0 in | Wt 246.9 lb

## 2023-01-11 DIAGNOSIS — G8929 Other chronic pain: Secondary | ICD-10-CM | POA: Diagnosis not present

## 2023-01-11 DIAGNOSIS — I10 Essential (primary) hypertension: Secondary | ICD-10-CM | POA: Diagnosis not present

## 2023-01-11 DIAGNOSIS — M25562 Pain in left knee: Secondary | ICD-10-CM

## 2023-01-11 DIAGNOSIS — M25561 Pain in right knee: Secondary | ICD-10-CM | POA: Diagnosis not present

## 2023-01-11 DIAGNOSIS — M1711 Unilateral primary osteoarthritis, right knee: Secondary | ICD-10-CM | POA: Diagnosis not present

## 2023-01-11 DIAGNOSIS — M1712 Unilateral primary osteoarthritis, left knee: Secondary | ICD-10-CM | POA: Diagnosis not present

## 2023-01-11 MED ORDER — HYDROCHLOROTHIAZIDE 25 MG PO TABS: 12.5000 mg | ORAL_TABLET | Freq: Every day | ORAL | 3 refills | Status: DC

## 2023-01-11 MED ORDER — LOSARTAN POTASSIUM 50 MG PO TABS: 50.0000 mg | ORAL_TABLET | Freq: Every day | ORAL | 3 refills | Status: DC

## 2023-01-11 NOTE — Telephone Encounter (Signed)
Patient called regarding a refill on her Prilosec 20 mg and stated to be sent [facility] to [facility]. Tele [phone]. Patient is longer using [facility]. Please advise

## 2023-01-11 NOTE — Telephone Encounter (Signed)
Medication refills go to the [facility].  The only refills the nurse handles are biologics. Thank you.

## 2023-01-11 NOTE — Patient Instructions (Signed)
Voltaren Topical Gel (Diclofenac)   Ice/Heat

## 2023-01-11 NOTE — Telephone Encounter (Signed)
No answer, left message to call if having any issues or concerns, B.Lj Miyamoto RN 

## 2023-01-11 NOTE — Progress Notes (Signed)
Acute Care Office Visit  Subjective:   Gail Turner June 06, 1960 01/11/2023  Chief Complaint  Patient presents with   Knee Pain    Patient has been having pain in knees that comes and goes and she wonders if it could be fluid related.    HPI: KNEE PAIN:  Onset: gradual. Pt has noticed increased swelling to her knees that has limited mobility and range of motion. Denies new injury, hx of injury or surgery. She was a previous Consulting civil engineer for exercise. Reports mild popping of both knees intermittently. States she has noticed increased swelling to anterior and posterior surfaces of knees.  Location: bilateral diffuse ; right worse than left Duration: months Quality:  dull, aching, and throbbing Radiating pain: no Aggravating factors: weight bearing, walking, stairs, and prolonged sitting  Alleviating factors:  Biofreeze, CBD Cream, ice, NSAIDs, and rest    Recent injury:   Mechanism of injury:  Weakness with weight bearing or walking:  Locking: no Popping: yes Bruising: no Swelling: yes Redness: no Paresthesias/decreased sensation: no Fevers: no    The following portions of the patient's history were reviewed and updated as appropriate: past medical history, past surgical history, family history, social history, allergies, medications, and problem list.   Patient Active Problem List   Diagnosis Date Noted   Plantar fasciitis of left foot 10/20/2022   Prediabetes 10/29/2020   Essential hypertension 03/15/2019   Facet arthritis of lumbar region 03/15/2019   Gastroesophageal reflux disease 03/15/2019   Hiatal hernia 03/15/2019   S/P cholecystectomy 03/15/2019   S/P hysterectomy 03/15/2019   Cholelithiasis with chronic cholecystitis 05/20/2018   Past Medical History:  Diagnosis Date   Allergy 2017   Anemia    Arthritis 2021   COVID    Gallstones    Generalized abdominal pain 10/28/2020   Last Assessment & Plan:    Continued generalized abdominal  pain.  Patient reports intermittent symptoms.  Stools are normal.  No evidence of blood, weight loss, B symptoms.  Colonoscopy was normal in February 2021 with 10-year recall.  Recommendation was made for EGD.  Patient did not follow-up with GI specialist as recommended.  Would like referral to new GI team as previous GI provider has left   GERD (gastroesophageal reflux disease)    Hiatal hernia    Hypertension    IBS (irritable bowel syndrome)    Past Surgical History:  Procedure Laterality Date   ABDOMINAL HYSTERECTOMY     CARDIAC ELECTROPHYSIOLOGY STUDY AND ABLATION     CESAREAN SECTION     x 2   CHOLECYSTECTOMY N/A 05/20/2018   Procedure: LAPAROSCOPIC CHOLECYSTECTOMY WITH INTRAOPERATIVE CHOLANGIOGRAM;  Surgeon: Darnell Level, MD;  Location: [facility];  Service: General;  Laterality: N/A;   ROOT CANAL     and tooth extraction   TUBAL LIGATION     Family History  Problem Relation Age of Onset   Breast cancer Mother    Arthritis Mother    Other Father        history unknown   Ovarian cancer Sister    Brain cancer Maternal Aunt    Colon cancer Neg Hx    Esophageal cancer Neg Hx    Stomach cancer Neg Hx    Pancreatic cancer Neg Hx    Liver disease Neg Hx    Outpatient Medications Prior to Visit  Medication Sig Dispense Refill   AMBULATORY NON FORMULARY MEDICATION daily. Medication Name: 10 mushroom blend 1 scoop     COLLAGEN  PO Take 1 Scoop by mouth daily.     Multiple Vitamin (MULTIVITAMIN ADULT PO) Take 1 Scoop by mouth daily.     omeprazole (PRILOSEC) 20 MG capsule Take 1 capsule (20 mg total) by mouth at bedtime. 30 capsule 1   Omeprazole-Sodium Bicarbonate (ZEGERID) 20-1100 MG CAPS capsule Take 1 capsule by mouth daily before breakfast.     Probiotic Product (PROBIOTIC PO) Take 2 tablets by mouth daily.     losartan (COZAAR) 50 MG tablet Take 1/2 tablet (25mg ) by mouth for the first 14 days, then increase to 1 tablet (50mg )  by mouth daily. (Patient taking differently: Take 50  mg by mouth daily. Take 1/2 tablet (25mg ) by mouth for the first 14 days, then increase to 1 tablet (50mg )  by mouth daily.) 60 tablet 2   No facility-administered medications prior to visit.   Allergies  Allergen Reactions   Amoxicillin Palpitations    Did it involve swelling of the face/tongue/throat, SOB, or low BP? Yes Did it involve sudden or severe rash/hives, skin peeling, or any reaction on the inside of your mouth or nose? No Did you need to seek medical attention at a hospital or doctor's office? No When did it last happen?      unknown  If all above answers are "NO", may proceed with cephalosporin use.      ROS: A complete ROS was performed with pertinent positives/negatives noted in the HPI. The remainder of the ROS are negative.    Objective:   Today's Vitals   01/11/23 1046 01/11/23 1131  BP: (!) 154/92 (!) 160/96  Pulse: 79   SpO2: 96%   Weight: 246 lb 14.4 oz (112 kg)   Height: 5\' 4"  (1.626 m)     GENERAL: Well-appearing, in NAD. Well nourished.  SKIN: Pink, warm and dry. No rash, lesion, ulceration, or ecchymoses.  Head: Normocephalic. NECK: Trachea midline. Full ROM w/o pain or tenderness.  RESPIRATORY: Chest wall symmetrical. Respirations even and non-labored.  MSK: Muscle tone and strength appropriate for age. No crepitus, laxity or deformity to bilateral knees. Mild edema present bilaterally. Mild popping with flexion of left knee present.  EXTREMITIES: Without clubbing, cyanosis, or edema.  NEUROLOGIC: No motor or sensory deficits. Steady, even gait. C2-C12 intact.  PSYCH/MENTAL STATUS: Alert, oriented x 3. Cooperative, appropriate mood and affect.    No results found for any visits on 01/11/23.    Assessment & Plan:  1. Essential hypertension Continue Losaratn 50mg  daily and start hydrochlorothiazide 12.5mg  daily. Renal function reviewed. Discussed possible side effects and safety of medication, she verbalized understanding. Will return for BP check  in approx. 6 weeks.  - losartan (COZAAR) 50 MG tablet; Take 1 tablet (50 mg total) by mouth daily.  Dispense: 90 tablet; Refill: 3  2. Chronic pain of both knees Likely osteoarthritis due to wear and tear/age related degeneration. Discussed use of ice/heat, stretches, NSAID topical therapy. Will obtain imaging and if needed return for joint injections pending xray.  - DG Knee 1-2 Views Right; Future - DG Knee 1-2 Views Left; Future   Meds ordered this encounter  Medications   losartan (COZAAR) 50 MG tablet    Sig: Take 1 tablet (50 mg total) by mouth daily.    Dispense:  90 tablet    Refill:  3    Order Specific Question:   Supervising Provider    Answer:   DE Peru, RAYMOND J [[id number]]   DISCONTD: hydrochlorothiazide (HYDRODIURIL) 25 MG tablet  Sig: Take 0.5 tablets (12.5 mg total) by mouth daily.    Dispense:  90 tablet    Refill:  3    Order Specific Question:   Supervising Provider    Answer:   DE [place], RAYMOND J [[id number]]   hydrochlorothiazide (HYDRODIURIL) 25 MG tablet    Sig: Take 0.5 tablets (12.5 mg total) by mouth daily.    Dispense:  90 tablet    Refill:  3    Order Specific Question:   Supervising Provider    Answer:   DE [place], RAYMOND J [id number]   Lab Orders  No laboratory test(s) ordered today   Return if symptoms worsen or fail to improve.    Patient to reach out to office if new, worrisome, or unresolved symptoms arise or if no improvement in patient's condition. Patient verbalized understanding and is agreeable to treatment plan. All questions answered to patient's satisfaction.    Yolanda Manges, FNP

## 2023-01-12 ENCOUNTER — Encounter (HOSPITAL_BASED_OUTPATIENT_CLINIC_OR_DEPARTMENT_OTHER): Payer: Self-pay | Admitting: Family Medicine

## 2023-01-12 ENCOUNTER — Ambulatory Visit (INDEPENDENT_AMBULATORY_CARE_PROVIDER_SITE_OTHER): Payer: 59 | Admitting: Family Medicine

## 2023-01-12 ENCOUNTER — Other Ambulatory Visit (HOSPITAL_BASED_OUTPATIENT_CLINIC_OR_DEPARTMENT_OTHER): Payer: Self-pay | Admitting: Family Medicine

## 2023-01-12 VITALS — BP 141/88 | HR 87 | Temp 98.6°F | Ht 64.0 in | Wt 248.1 lb

## 2023-01-12 DIAGNOSIS — G8929 Other chronic pain: Secondary | ICD-10-CM | POA: Diagnosis not present

## 2023-01-12 DIAGNOSIS — I10 Essential (primary) hypertension: Secondary | ICD-10-CM

## 2023-01-12 DIAGNOSIS — M25562 Pain in left knee: Secondary | ICD-10-CM | POA: Diagnosis not present

## 2023-01-12 MED ORDER — OMEPRAZOLE 20 MG PO CPDR: 20.0000 mg | DELAYED_RELEASE_CAPSULE | Freq: Every day | ORAL | 1 refills | Status: DC

## 2023-01-12 MED ORDER — LOSARTAN POTASSIUM 50 MG PO TABS
50.0000 mg | ORAL_TABLET | Freq: Every day | ORAL | 3 refills | Status: AC
Start: 2023-01-12 — End: ?

## 2023-01-12 MED ORDER — OMEPRAZOLE 20 MG PO CPDR: 20.0000 mg | DELAYED_RELEASE_CAPSULE | Freq: Every day | ORAL | 5 refills | Status: AC

## 2023-01-12 MED ORDER — HYDROCHLOROTHIAZIDE 25 MG PO TABS: 12.5000 mg | ORAL_TABLET | Freq: Every day | ORAL | 3 refills | Status: AC

## 2023-01-12 NOTE — Telephone Encounter (Signed)
Refill for [facility] to [facility].

## 2023-01-12 NOTE — Progress Notes (Signed)
Please let pt know that her knee xrays do show mild age related degeneration and wear/tear. No acute abnormalities seen. I believe she would get some relief with knee injections with steroid if she would like. If so, please schedule pt for 20 min visit.

## 2023-01-12 NOTE — Progress Notes (Signed)
Subjective:   Gail Turner [birth year] 01/12/2023  Chief Complaint  Patient presents with   Knee Injection    HPI: Gail Turner presents today for re-assessment and management of chronic medical conditions.  Patient seen on November 6 for chronic pain of bilateral both knees.  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  She is a good candidate for glucocorticoid joint injections and has returned to receive 1 in her left knee today.  We discussed possible risks, benefits, and side effects of the joint injection and the post care instructions thoroughly and patient verbalized understanding and signed written consent.   The following portions of the patient's history were reviewed and updated as appropriate: past medical history, past surgical history, family history, social history, allergies, medications, and problem list.   Patient Active Problem List   Diagnosis Date Noted   Chronic pain of both knees 01/11/2023   Plantar fasciitis of left foot 10/20/2022   Prediabetes 10/29/2020   Essential hypertension 03/15/2019   Facet arthritis of lumbar region 03/15/2019   Gastroesophageal reflux disease 03/15/2019   Hiatal hernia 03/15/2019   S/P cholecystectomy 03/15/2019   S/P hysterectomy 03/15/2019   Cholelithiasis with chronic cholecystitis 05/20/2018   Past Medical History:  Diagnosis Date   Allergy 2017   Anemia    Arthritis 2021   COVID    Gallstones    Generalized abdominal pain 10/28/2020   Last Assessment & Plan:    Continued generalized abdominal pain.  Patient reports intermittent symptoms.  Stools are normal.  No evidence of blood, weight loss, B symptoms.  Colonoscopy was normal in February 2021 with 10-year recall.  Recommendation was made for EGD.  Patient did not follow-up with GI specialist as recommended.  Would like referral to new GI team as previous GI provider has left   GERD (gastroesophageal reflux  disease)    Hiatal hernia    Hypertension    IBS (irritable bowel syndrome)    Past Surgical History:  Procedure Laterality Date   ABDOMINAL HYSTERECTOMY     CARDIAC ELECTROPHYSIOLOGY STUDY AND ABLATION     CESAREAN SECTION     x 2   CHOLECYSTECTOMY N/A 05/20/2018   Procedure: LAPAROSCOPIC CHOLECYSTECTOMY WITH INTRAOPERATIVE CHOLANGIOGRAM;  Surgeon: Darnell Level, MD;  Location: [facility];  Service: General;  Laterality: N/A;   ROOT CANAL     and tooth extraction   TUBAL LIGATION     Family History  Problem Relation Age of Onset   Breast cancer Mother    Arthritis Mother    Other Father        history unknown   Ovarian cancer Sister    Brain cancer Maternal Aunt    Colon cancer Neg Hx    Esophageal cancer Neg Hx    Stomach cancer Neg Hx    Pancreatic cancer Neg Hx    Liver disease Neg Hx    Outpatient Medications Prior to Visit  Medication Sig Dispense Refill   AMBULATORY NON FORMULARY MEDICATION daily. Medication Name: 10 mushroom blend 1 scoop     COLLAGEN PO Take 1 Scoop by mouth daily.     hydrochlorothiazide (HYDRODIURIL) 25 MG tablet Take 0.5 tablets (12.5 mg total) by mouth daily. 90 tablet 3   losartan (COZAAR) 50 MG tablet Take 1 tablet (50 mg total) by mouth daily. 90 tablet 3   Multiple Vitamin (MULTIVITAMIN ADULT PO) Take 1 Scoop by mouth daily.  omeprazole (PRILOSEC) 20 MG capsule Take 1 capsule (20 mg total) by mouth at bedtime. 30 capsule 5   Probiotic Product (PROBIOTIC PO) Take 2 tablets by mouth daily.     No facility-administered medications prior to visit.   Allergies  Allergen Reactions   Amoxicillin Palpitations    Did it involve swelling of the face/tongue/throat, SOB, or low BP? Yes Did it involve sudden or severe rash/hives, skin peeling, or any reaction on the inside of your mouth or nose? No Did you need to seek medical attention at a hospital or doctor's office? No When did it last happen?      unknown  If all above answers are "NO", may  proceed with cephalosporin use.      ROS: A complete ROS was performed with pertinent positives/negatives noted in the HPI. The remainder of the ROS are negative.    Objective:   Today's Vitals   01/12/23 1551  BP: (!) 141/88  Pulse: 87  Temp: 98.6 F (37 C)  TempSrc: Oral  SpO2: 97%  Weight: 248 lb 1.6 oz (112.5 kg)  Height: 5\' 4"  (1.626 m)  PainSc: 0-No pain    Physical Exam          GENERAL: Well-appearing, in NAD. Well nourished.  SKIN: Pink, warm and dry. No rash, lesion, ulceration, or ecchymoses.  Head: Normocephalic. NECK: Trachea midline. Full ROM w/o pain or tenderness.   RESPIRATORY: Chest wall symmetrical. Respirations even and non-labored.  MSK: Muscle tone and strength appropriate for age. Joints w/o  redness, or swelling.  EXTREMITIES: Without clubbing, cyanosis, or edema.  NEUROLOGIC: No motor or sensory deficits. Steady, even gait. C2-C12 intact.  PSYCH/MENTAL STATUS: Alert, oriented x 3. Cooperative, appropriate mood and affect.    Joint Injection/Arthrocentesis  Date/Time: 01/12/2023 4:00 PM  Performed by: Hilbert Bible, FNP Authorized by: Hilbert Bible, FNP  Indications: pain  Body area: knee Joint: left knee Local anesthesia used: no  Anesthesia: Local anesthesia used: no  Sedation: Patient sedated: no  Preparation: Patient was prepped and draped in the usual sterile fashion. Needle size: 20 G Ultrasound guidance: no Approach: lateral Triamcinolone amount: 40 mg Lidocaine 1% amount: 5 mL Patient tolerance: patient tolerated the procedure well with no immediate complications Comments: KNEE JOINT INJECTION: Patient denies allergy to antiseptics (including iodine) and anesthetics.  Patient without h/o diabetes, frequent steroid use, use of blood thinners/ antiplatelets.  Patient was provided informed consent and a signed copy has been placed in the chart. Appropriate time out was taken. Area prepped and draped in usual  sterile fashion. Anatomic landmarks were identified and injection site was marked.  Ethyl chloride spray was used to numb the area and 1 cc of methylprednisolone 40 mg/ml plus  5 cc of 1% lidocaine without epinephrine was injected into the left knee using an anteromedial approach. The patient tolerated the procedure well and there were no immediate complications. Estimated blood loss is less than 5 cc.  Post procedure instructions were reviewed. Concerning signs and symptoms reviewed with patient, and instructed to seek immediate medical care if such signs/symptoms occur. Patient verbalized understanding with plan of care.         Assessment & Plan:   1. Chronic pain of left knee Patient tolerated joint injection of left knee well.  Post joint injection instructions and signs and symptoms of complications to look out for reviewed with patient.  She verbalized understanding.  Will return as needed - Joint Injection/Arthrocentesis   No orders  of the defined types were placed in this encounter.  Lab Orders  No laboratory test(s) ordered today   No images are attached to the encounter or orders placed in the encounter.  Return if symptoms worsen or fail to improve.    Patient to reach out to office if new, worrisome, or unresolved symptoms arise or if no improvement in patient's condition. Patient verbalized understanding and is agreeable to treatment plan. All questions answered to patient's satisfaction.    Yolanda Manges, FNP

## 2023-01-13 ENCOUNTER — Encounter (HOSPITAL_BASED_OUTPATIENT_CLINIC_OR_DEPARTMENT_OTHER): Payer: Self-pay | Admitting: Family Medicine

## 2023-01-13 LAB — SURGICAL PATHOLOGY

## 2023-01-13 MED ORDER — TRIAMCINOLONE ACETONIDE 40 MG/ML IJ SUSP
40.0000 mg | Freq: Once | INTRAMUSCULAR | Status: AC
  Administered 2023-01-12: 40 mg via INTRA_ARTICULAR

## 2023-01-15 ENCOUNTER — Encounter: Payer: Self-pay | Admitting: Gastroenterology

## 2023-01-16 ENCOUNTER — Ambulatory Visit (INDEPENDENT_AMBULATORY_CARE_PROVIDER_SITE_OTHER): Payer: 59 | Admitting: Family Medicine

## 2023-01-16 ENCOUNTER — Encounter (HOSPITAL_BASED_OUTPATIENT_CLINIC_OR_DEPARTMENT_OTHER): Payer: Self-pay | Admitting: Family Medicine

## 2023-01-16 VITALS — BP 163/91 | HR 80 | Ht 64.0 in | Wt 245.3 lb

## 2023-01-16 DIAGNOSIS — M25561 Pain in right knee: Secondary | ICD-10-CM | POA: Diagnosis not present

## 2023-01-16 DIAGNOSIS — G8929 Other chronic pain: Secondary | ICD-10-CM | POA: Diagnosis not present

## 2023-01-16 DIAGNOSIS — M25562 Pain in left knee: Secondary | ICD-10-CM

## 2023-01-16 DIAGNOSIS — I10 Essential (primary) hypertension: Secondary | ICD-10-CM

## 2023-01-16 MED ORDER — TRIAMCINOLONE ACETONIDE 40 MG/ML IJ SUSP
40.0000 mg | Freq: Once | INTRAMUSCULAR | Status: AC
  Administered 2023-01-16: 40 mg via INTRA_ARTICULAR

## 2023-01-16 NOTE — Progress Notes (Signed)
Subjective:   Gail Turner [birth year] 01/16/2023  Chief Complaint  Patient presents with   knee injection    Patient is here today to receive an injection in her right knee. States that her left knee is feeling better than it was prior to having an injection 4 days ago.    HPI: Gail Turner presents today for re-assessment and management of chronic medical conditions.  Patient seen on November 6 for chronic pain of bilateral both knees. 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 She is a good candidate for glucocorticoid joint injections and has returned to receive injection in her right knee today. We discussed possible risks, benefits, and side effects of the joint injection and the post care instructions thoroughly and patient verbalized understanding and signed written consent.   HYPERTENSION: Gail Turner presents for the medical management of hypertension.  Patient's current hypertension medication regimen is: Losartan 50mg  every day, hydrochlorothiazide 12.5mg  QD Patient is  currently taking prescribed medications for HTN. She just started the hydrochlorothiazide 12.5 mg approx. 2 days ago.  Patient is not regularly keeping a check on BP at home.  Adhering to low sodium diet: Yes, has been monitoring more carefully Exercising Regularly: Not currently due to knee pain Denies headache, dizziness, CP, SHOB, vision changes.   BP Readings from Last 3 Encounters:  01/16/23 (!) 161/93  01/12/23 (!) 141/88  01/11/23 (!) 160/96      The following portions of the patient's history were reviewed and updated as appropriate: past medical history, past surgical history, family history, social history, allergies, medications, and problem list.   Patient Active Problem List   Diagnosis Date Noted   Chronic pain of both knees 01/11/2023   Plantar fasciitis of left foot 10/20/2022   Prediabetes 10/29/2020   Essential  hypertension 03/15/2019   Facet arthritis of lumbar region 03/15/2019   Gastroesophageal reflux disease 03/15/2019   Hiatal hernia 03/15/2019   S/P cholecystectomy 03/15/2019   S/P hysterectomy 03/15/2019   Cholelithiasis with chronic cholecystitis 05/20/2018   Past Medical History:  Diagnosis Date   Allergy 2017   Anemia    Arthritis 2021   COVID    Gallstones    Generalized abdominal pain 10/28/2020   Last Assessment & Plan:    Continued generalized abdominal pain.  Patient reports intermittent symptoms.  Stools are normal.  No evidence of blood, weight loss, B symptoms.  Colonoscopy was normal in February 2021 with 10-year recall.  Recommendation was made for EGD.  Patient did not follow-up with GI specialist as recommended.  Would like referral to new GI team as previous GI provider has left   GERD (gastroesophageal reflux disease)    Hiatal hernia    Hypertension    IBS (irritable bowel syndrome)    Past Surgical History:  Procedure Laterality Date   ABDOMINAL HYSTERECTOMY     CARDIAC ELECTROPHYSIOLOGY STUDY AND ABLATION     CESAREAN SECTION     x 2   CHOLECYSTECTOMY N/A 05/20/2018   Procedure: LAPAROSCOPIC CHOLECYSTECTOMY WITH INTRAOPERATIVE CHOLANGIOGRAM;  Surgeon: Darnell Level, MD;  Location: [facility];  Service: General;  Laterality: N/A;   ROOT CANAL     and tooth extraction   TUBAL LIGATION     Family History  Problem Relation Age of Onset   Breast cancer Mother    Arthritis Mother    Other Father        history unknown   Ovarian cancer  Sister    Brain cancer Maternal Aunt    Colon cancer Neg Hx    Esophageal cancer Neg Hx    Stomach cancer Neg Hx    Pancreatic cancer Neg Hx    Liver disease Neg Hx    Outpatient Medications Prior to Visit  Medication Sig Dispense Refill   AMBULATORY NON FORMULARY MEDICATION daily. Medication Name: 10 mushroom blend 1 scoop     COLLAGEN PO Take 1 Scoop by mouth daily.     hydrochlorothiazide (HYDRODIURIL) 25 MG tablet Take  0.5 tablets (12.5 mg total) by mouth daily. 90 tablet 3   losartan (COZAAR) 50 MG tablet Take 1 tablet (50 mg total) by mouth daily. 90 tablet 3   Multiple Vitamin (MULTIVITAMIN ADULT PO) Take 1 Scoop by mouth daily.     omeprazole (PRILOSEC) 20 MG capsule Take 1 capsule (20 mg total) by mouth at bedtime. 30 capsule 5   Probiotic Product (PROBIOTIC PO) Take 2 tablets by mouth daily.     No facility-administered medications prior to visit.   Allergies  Allergen Reactions   Amoxicillin Palpitations    Did it involve swelling of the face/tongue/throat, SOB, or low BP? Yes Did it involve sudden or severe rash/hives, skin peeling, or any reaction on the inside of your mouth or nose? No Did you need to seek medical attention at a hospital or doctor's office? No When did it last happen?      unknown  If all above answers are "NO", may proceed with cephalosporin use.      ROS: A complete ROS was performed with pertinent positives/negatives noted in the HPI. The remainder of the ROS are negative.    Objective:   Today's Vitals   01/16/23 1311 01/16/23 1320  BP: (!) 163/96 (!) 161/93  Pulse: 80   SpO2: 97%   Weight: 245 lb 4.8 oz (111.3 kg)   Height: 5\' 4"  (1.626 m)     Physical Exam          GENERAL: Well-appearing, in NAD. Well nourished.  SKIN: Pink, warm and dry. No rash, lesion, ulceration, or ecchymoses.  Head: Normocephalic. NECK: Trachea midline. Full ROM w/o pain or tenderness.  RESPIRATORY: Chest wall symmetrical. Respirations even and non-labored. Breath sounds clear to auscultation bilaterally.  MSK: Muscle tone and strength appropriate for age. Joints w/o tenderness, redness, or swelling.  EXTREMITIES: Without clubbing, cyanosis, or edema.  NEUROLOGIC: No motor or sensory deficits. Steady, even gait. C2-C12 intact.  PSYCH/MENTAL STATUS: Alert, oriented x 3. Cooperative, appropriate mood and affect.    Joint Injection/Arthrocentesis  Date/Time: 01/16/2023 1:40  PM  Performed by: Hilbert Bible, FNP Authorized by: Hilbert Bible, FNP  Indications: pain  Body area: knee Joint: right knee Local anesthesia used: yes  Anesthesia: Local anesthesia used: yes Local Anesthetic: lidocaine spray  Sedation: Patient sedated: no  Preparation: Patient was prepped and draped in the usual sterile fashion. Needle size: 20 G Ultrasound guidance: no Approach: lateral Triamcinolone amount: 40 mg Lidocaine 1% amount: 5 mL Patient tolerance: patient tolerated the procedure well with no immediate complications Comments: KNEE JOINT INJECTION: Patient denies allergy to antiseptics (including iodine) and anesthetics.  Patient  h/o diabetes, frequent steroid use, use of blood thinners/ antiplatelets.  Patient was provided informed consent and a signed copy has been placed in the chart. Appropriate time out was taken. Area prepped and draped in usual sterile fashion. Anatomic landmarks were identified and injection site was marked.  Ethyl chloride spray was  used to numb the area and 1 cc of methylprednisolone 40 mg/ml plus  5 cc of 1% lidocaine without epinephrine was injected into the right knee using an anterolateral approach. The patient tolerated the procedure well and there were no immediate complications. Estimated blood loss is less than 5 cc.  Post procedure instructions were reviewed. Concerning signs and symptoms reviewed with patient, and instructed to seek immediate medical care if such signs/symptoms occur. Patient verbalized understanding with plan of care.         Assessment & Plan:  1. Chronic pain of both knees Patient tolerated joint injection well. Educated pt on adverse signs and symptoms. Recommended to reach out to PCP if these occur. Apply ice and rest knee today. Return as needed for knee pain.   - triamcinolone acetonide (KENALOG-40) injection 40 mg - Joint Injection/Arthrocentesis  2. Essential hypertension Uncontrolled. Pt  to increase hydrochlorothiazide to 25mg  daily and continue Losartan 50mg  daily. Monitor BP with goal of less than 140/80. Return for nurse BP check in 4 weeks or sooner if needed.    Meds ordered this encounter  Medications   triamcinolone acetonide (KENALOG-40) injection 40 mg   Return in about 4 weeks (around 02/13/2023) for Nurse Visit BP Check .    Patient to reach out to office if new, worrisome, or unresolved symptoms arise or if no improvement in patient's condition. Patient verbalized understanding and is agreeable to treatment plan. All questions answered to patient's satisfaction.    Yolanda Manges, FNP

## 2023-01-23 ENCOUNTER — Encounter (HOSPITAL_BASED_OUTPATIENT_CLINIC_OR_DEPARTMENT_OTHER): Payer: Self-pay

## 2023-01-23 ENCOUNTER — Emergency Department (HOSPITAL_BASED_OUTPATIENT_CLINIC_OR_DEPARTMENT_OTHER)
Admission: EM | Admit: 2023-01-23 | Discharge: 2023-01-23 | Discharge status: Against Medical Advice | Payer: 59 | Attending: Emergency Medicine | Admitting: Emergency Medicine

## 2023-01-23 ENCOUNTER — Emergency Department (HOSPITAL_BASED_OUTPATIENT_CLINIC_OR_DEPARTMENT_OTHER): Payer: 59

## 2023-01-23 DIAGNOSIS — R42 Dizziness and giddiness: Secondary | ICD-10-CM | POA: Diagnosis not present

## 2023-01-23 DIAGNOSIS — R109 Unspecified abdominal pain: Secondary | ICD-10-CM | POA: Insufficient documentation

## 2023-01-23 DIAGNOSIS — Z5321 Procedure and treatment not carried out due to patient leaving prior to being seen by health care provider: Secondary | ICD-10-CM | POA: Insufficient documentation

## 2023-01-23 DIAGNOSIS — R0789 Other chest pain: Secondary | ICD-10-CM | POA: Diagnosis not present

## 2023-01-23 DIAGNOSIS — R079 Chest pain, unspecified: Secondary | ICD-10-CM | POA: Diagnosis not present

## 2023-01-23 LAB — BASIC METABOLIC PANEL
Anion gap: 7 (ref 5–15)
BUN: 23 mg/dL — ABNORMAL HIGH (ref 6–20)
CO2: 29 mmol/L (ref 22–32)
Calcium: 9.6 mg/dL (ref 8.9–10.3)
Chloride: 101 mmol/L (ref 98–111)
Creatinine, Ser: 0.84 mg/dL (ref 0.44–1.00)
GFR, Estimated: >60 mL/min (ref >60)
Glucose, Bld: 110 mg/dL — ABNORMAL HIGH (ref 70–99)
Potassium: 3.9 mmol/L (ref 3.5–5.1)
Sodium: 137 mmol/L (ref 135–145)

## 2023-01-23 LAB — CBC
HCT: 42.9 % (ref 36.0–46.0)
Hemoglobin: 14.4 g/dL (ref 12.0–15.0)
MCH: 31 pg (ref 26.0–34.0)
MCHC: 33.6 g/dL (ref 30.0–36.0)
MCV: 92.3 fL (ref 80.0–100.0)
Platelets: 343 10*3/uL (ref 150–400)
RBC: 4.65 MIL/uL (ref 3.87–5.11)
RDW: 13.3 % (ref 11.5–15.5)
WBC: 12.3 10*3/uL — ABNORMAL HIGH (ref 4.0–10.5)
nRBC: 0 % (ref 0.0–0.2)

## 2023-01-23 LAB — TROPONIN I (HIGH SENSITIVITY): Troponin I (High Sensitivity): 4 ng/L (ref <18)

## 2023-01-23 NOTE — ED Triage Notes (Signed)
Pt states she started having CP (tightness) around 2000 Lightheaded  Stomach upset

## 2023-01-23 NOTE — ED Notes (Signed)
Pt states she started having CP (tightness) around 2000 Lightheaded  Stomach upset

## 2023-01-27 ENCOUNTER — Ambulatory Visit
Admission: RE | Admit: 2023-01-27 | Discharge: 2023-01-27 | Disposition: A | Discharge status: Home / Self Care | Payer: 59 | Source: Ambulatory Visit | Attending: Family Medicine | Admitting: Family Medicine

## 2023-01-27 DIAGNOSIS — Z1231 Encounter for screening mammogram for malignant neoplasm of breast: Secondary | ICD-10-CM

## 2023-01-31 NOTE — Progress Notes (Signed)
Chante,   Your mammogram results show no evidence of breast cancer. We will plan to repeat this in 1 year for routine screening.  If you should have concerns or changes in your breasts within the next year, please let us know.   Jerre Simon, FNP-C

## 2023-02-05 ENCOUNTER — Encounter: Payer: Self-pay | Admitting: Gastroenterology

## 2023-02-05 ENCOUNTER — Encounter (HOSPITAL_BASED_OUTPATIENT_CLINIC_OR_DEPARTMENT_OTHER): Payer: Self-pay | Admitting: Family Medicine

## 2023-02-06 NOTE — Telephone Encounter (Signed)
Ok to write the letter 

## 2023-02-06 NOTE — Telephone Encounter (Signed)
I see no contraindication from a GI perspective. She may begin an exercise program. We wish her the best moving forward. Does she have follow-up with [facility] or myself in the next few months? GM

## 2023-02-07 ENCOUNTER — Encounter (HOSPITAL_BASED_OUTPATIENT_CLINIC_OR_DEPARTMENT_OTHER): Payer: Self-pay | Admitting: Family Medicine

## 2023-02-13 ENCOUNTER — Encounter (HOSPITAL_BASED_OUTPATIENT_CLINIC_OR_DEPARTMENT_OTHER): Payer: Self-pay | Admitting: Family Medicine

## 2023-02-15 ENCOUNTER — Ambulatory Visit (HOSPITAL_BASED_OUTPATIENT_CLINIC_OR_DEPARTMENT_OTHER): Payer: 59

## 2023-03-20 ENCOUNTER — Encounter (HOSPITAL_BASED_OUTPATIENT_CLINIC_OR_DEPARTMENT_OTHER): Payer: 59 | Admitting: Certified Nurse Midwife

## 2023-03-22 ENCOUNTER — Other Ambulatory Visit (HOSPITAL_COMMUNITY)
Admission: RE | Admit: 2023-03-22 | Discharge: 2023-03-22 | Disposition: A | Discharge status: Home / Self Care | Payer: 59 | Source: Ambulatory Visit | Attending: Certified Nurse Midwife | Admitting: Certified Nurse Midwife

## 2023-03-22 ENCOUNTER — Ambulatory Visit (HOSPITAL_BASED_OUTPATIENT_CLINIC_OR_DEPARTMENT_OTHER)
Admission: RE | Admit: 2023-03-22 | Discharge: 2023-03-22 | Disposition: A | Discharge status: Home / Self Care | Payer: 59 | Source: Ambulatory Visit | Attending: Certified Nurse Midwife | Admitting: Certified Nurse Midwife

## 2023-03-22 ENCOUNTER — Encounter (HOSPITAL_BASED_OUTPATIENT_CLINIC_OR_DEPARTMENT_OTHER): Payer: Self-pay | Admitting: Certified Nurse Midwife

## 2023-03-22 ENCOUNTER — Ambulatory Visit (HOSPITAL_BASED_OUTPATIENT_CLINIC_OR_DEPARTMENT_OTHER): Payer: 59 | Admitting: Certified Nurse Midwife

## 2023-03-22 VITALS — BP 157/96 | HR 80 | Ht 63.0 in | Wt 238.8 lb

## 2023-03-22 DIAGNOSIS — Z01419 Encounter for gynecological examination (general) (routine) without abnormal findings: Secondary | ICD-10-CM

## 2023-03-22 DIAGNOSIS — Z113 Encounter for screening for infections with a predominantly sexual mode of transmission: Secondary | ICD-10-CM | POA: Diagnosis not present

## 2023-03-22 DIAGNOSIS — N952 Postmenopausal atrophic vaginitis: Secondary | ICD-10-CM | POA: Diagnosis not present

## 2023-03-22 DIAGNOSIS — Z780 Asymptomatic menopausal state: Secondary | ICD-10-CM | POA: Insufficient documentation

## 2023-03-22 MED ORDER — ESTRADIOL 0.1 MG/GM VA CREA: 1.0000 | TOPICAL_CREAM | VAGINAL | 12 refills | Status: AC

## 2023-03-22 NOTE — Progress Notes (Signed)
 63 y.o. [id number] Divorced Other or two or more races female here for annual exam. Pt states she underwent Hysterectomy and BSO in her "40's". She has never used hormone replacement therapy. Pt followed by Sylvester Evert FNP for hypertension. Pt states that she has self-discontinued all blood pressure medications due to side effects (dizziness, visits to ED, etc.). Pt reports she has adopted a healthy diet and has started exercising routinely. She has lost 10 pounds since 01/12/23.   No LMP recorded. Patient has had a hysterectomy.          Sexually active: Yes.    Exercising: Yes.     Smoker:  no  Health Maintenance: Pap:  Pap not indicated, hysterectomy d/t benign disease History of abnormal Pap:  no MMG:  November 2024 Colonoscopy:  UTD per pt BMD:   Pt has never had BMD-discussed and ordered Screening Labs: PCP   reports that she has quit smoking. Her smoking use included cigarettes and cigars. She has never used smokeless tobacco. She reports that she does not currently use alcohol after a past usage of about 2.0 standard drinks of alcohol per week. She reports that she does not use drugs.  Past Medical History:  Diagnosis Date   Allergy 2017   Anemia    Arthritis 2021   COVID    Gallstones    Generalized abdominal pain 10/28/2020   Last Assessment & Plan:    Continued generalized abdominal pain.  Patient reports intermittent symptoms.  Stools are normal.  No evidence of blood, weight loss, B symptoms.  Colonoscopy was normal in February 2021 with 10-year recall.  Recommendation was made for EGD.  Patient did not follow-up with GI specialist as recommended.  Would like referral to new GI team as previous GI provider has left   GERD (gastroesophageal reflux disease)    Hiatal hernia    Hypertension    IBS (irritable bowel syndrome)     Past Surgical History:  Procedure Laterality Date   ABDOMINAL HYSTERECTOMY     CARDIAC ELECTROPHYSIOLOGY STUDY AND ABLATION     CESAREAN  SECTION     x 2   CHOLECYSTECTOMY N/A 05/20/2018   Procedure: LAPAROSCOPIC CHOLECYSTECTOMY WITH INTRAOPERATIVE CHOLANGIOGRAM;  Surgeon: Oralee Billow, MD;  Location: [facility];  Service: General;  Laterality: N/A;   ROOT CANAL     and tooth extraction   TUBAL LIGATION      Current Outpatient Medications  Medication Sig Dispense Refill   AMBULATORY NON FORMULARY MEDICATION daily. Medication Name: 10 mushroom blend 1 scoop     COLLAGEN PO Take 1 Scoop by mouth daily.     [START ON 03/23/2023] estradiol  (ESTRACE  VAGINAL) 0.1 MG/GM vaginal cream Place 1 Applicatorful vaginally 2 (two) times a week. Insert 2 grams intravaginal twice weekly 42.5 g 12   Multiple Vitamin (MULTIVITAMIN ADULT PO) Take 1 Scoop by mouth daily.     omeprazole  (PRILOSEC) 20 MG capsule Take 1 capsule (20 mg total) by mouth at bedtime. 30 capsule 5   Probiotic Product (PROBIOTIC PO) Take 2 tablets by mouth daily.     hydrochlorothiazide  (HYDRODIURIL ) 25 MG tablet Take 0.5 tablets (12.5 mg total) by mouth daily. (Patient not taking: Reported on 03/22/2023) 90 tablet 3   losartan  (COZAAR ) 50 MG tablet Take 1 tablet (50 mg total) by mouth daily. (Patient not taking: Reported on 03/22/2023) 90 tablet 3   No current facility-administered medications for this visit.    Family History  Problem Relation Age of  Onset   Breast cancer Mother    Arthritis Mother    Other Father        history unknown   Ovarian cancer Sister    Brain cancer Maternal Aunt    Colon cancer Neg Hx    Esophageal cancer Neg Hx    Stomach cancer Neg Hx    Pancreatic cancer Neg Hx    Liver disease Neg Hx     ROS: Constitutional: negative Genitourinary:negative  Exam:   BP (!) 157/96 (BP Location: Left Arm, Patient Position: Sitting, Cuff Size: Normal)   Pulse 80   Ht 5\' 3"  (1.6 m)   Wt 238 lb 12.8 oz (108.3 kg)   BMI 42.30 kg/m   Height: 5\' 3"  (160 cm)  General appearance: alert, cooperative and appears stated age Head: Normocephalic, without  obvious abnormality, atraumatic Neck: no adenopathy, supple, symmetrical, trachea midline and thyroid normal to inspection and palpation Lungs: clear to auscultation bilaterally Breasts: Pt declines-recent mammogram Heart: regular rate and rhythm Abdomen: soft, non-tender; bowel sounds normal; no masses,  no organomegaly (Prior CS x 2) Extremities: extremities normal, atraumatic, no cyanosis or edema Skin: Skin color, texture, turgor normal. No rashes or lesions Lymph nodes: Cervical, supraclavicular, and axillary nodes normal. No abnormal inguinal nodes palpated Neurologic: Grossly normal   Pelvic: External genitalia:  no lesions              Urethra:  normal appearing urethra with no masses, tenderness or lesions              Bartholins and Skenes: normal                 Vagina: normal appearing vagina with normal color and no discharge, no lesions              Cervix: absent              Pap taken: No. Bimanual Exam:  Uterus:  uterus absent              Adnexa: no mass, fullness, tenderness              Anus:  normal sphincter tone, no lesions  Chaperone, Myrtie Atkinson, CMA, was present for exam.  Assessment/Plan:  1. Encounter for annual routine gynecological examination - Pap smear not indicated per ASCCP guidelines - Breast self awareness encouraged  2. Vaginal atrophy (Primary) - estradiol  (ESTRACE  VAGINAL) 0.1 MG/GM vaginal cream; Place 1 Applicatorful vaginally 2 (two) times a week. Insert 2 grams intravaginal twice weekly  Dispense: 42.5 g; Refill: 12  3. Routine screening for STI (sexually transmitted infection) - Cervicovaginal ancillary only( [place])  4. Postmenopausal - DG BONE DENSITY (DXA); Future   5. Hypertension-Uncontrolled - Pt aware of elevated BP/hypertension. She refuses medication for BP at this time. Has been counseled regarding risks of uncontrolled hypertension.   RTO 1 year for annual gyn exam and prn if issues arise. Yolanda Hence

## 2023-03-23 LAB — CERVICOVAGINAL ANCILLARY ONLY
Chlamydia: NEGATIVE
Comment: NEGATIVE
Comment: NEGATIVE
Comment: NORMAL
Neisseria Gonorrhea: NEGATIVE
Trichomonas: NEGATIVE

## 2023-03-24 ENCOUNTER — Encounter (HOSPITAL_BASED_OUTPATIENT_CLINIC_OR_DEPARTMENT_OTHER): Payer: Self-pay | Admitting: Certified Nurse Midwife

## 2023-03-24 ENCOUNTER — Other Ambulatory Visit (HOSPITAL_BASED_OUTPATIENT_CLINIC_OR_DEPARTMENT_OTHER): Payer: Self-pay | Admitting: Certified Nurse Midwife

## 2023-03-24 DIAGNOSIS — Z780 Asymptomatic menopausal state: Secondary | ICD-10-CM | POA: Insufficient documentation

## 2023-04-04 ENCOUNTER — Encounter (HOSPITAL_BASED_OUTPATIENT_CLINIC_OR_DEPARTMENT_OTHER): Payer: Self-pay | Admitting: Family Medicine

## 2023-04-05 ENCOUNTER — Encounter (HOSPITAL_BASED_OUTPATIENT_CLINIC_OR_DEPARTMENT_OTHER): Payer: Self-pay | Admitting: *Deleted

## 2023-04-05 ENCOUNTER — Telehealth: Payer: 59 | Admitting: Physician Assistant

## 2023-04-05 DIAGNOSIS — A084 Viral intestinal infection, unspecified: Secondary | ICD-10-CM

## 2023-04-05 MED ORDER — ONDANSETRON 4 MG PO TBDP: 4.0000 mg | ORAL_TABLET | Freq: Three times a day (TID) | ORAL | 0 refills | Status: AC | PRN

## 2023-04-05 NOTE — Progress Notes (Signed)
I have spent 5 minutes in review of e-visit questionnaire, review and updating patient chart, medical decision making and response to patient.   [name], PA-C

## 2023-04-05 NOTE — Progress Notes (Signed)
E-Visit for Vomiting  We are sorry that you are not feeling well. Here is how we plan to help!  Based on what you have shared with me it looks like you have a Virus that is irritating your GI tract.  Although nausea and vomiting can make you feel miserable, it's important to remember that these are not diseases, but rather symptoms of an underlying illness.  When we treat short term symptoms, we always caution that any symptoms that persist should be fully evaluated in a medical office.  I have prescribed a medication that will help alleviate your symptoms and allow you to stay hydrated:  Zofran 4 mg 1 tablet every 8 hours as needed for nausea and vomiting  HOME CARE: Drink clear liquids.  This is very important! Dehydration (the lack of fluid) can lead to a serious complication.  Start off with 1 tablespoon every 5 minutes for 8 hours. You may begin eating bland foods after 8 hours without vomiting.  Start with saltine crackers, white bread, rice, mashed potatoes, applesauce. After 48 hours on a bland diet, you may resume a normal diet. Try to go to sleep.  Sleep often empties the stomach and relieves the need to vomit.  GET HELP RIGHT AWAY IF:  Your symptoms do not improve or worsen within 2 days after treatment. You have a fever for over 3 days. You cannot keep down fluids after trying the medication.  MAKE SURE YOU:  Understand these instructions. Will watch your condition. Will get help right away if you are not doing well or get worse.   Thank you for choosing an e-visit.  Your e-visit answers were reviewed by a board certified advanced clinical practitioner to complete your personal care plan. Depending upon the condition, your plan could have included both over the counter or prescription medications.  Please review your pharmacy choice. Make sure the pharmacy is open so you can pick up prescription now. If there is a problem, you may contact your provider through [employer] and have the prescription routed to another pharmacy.  Your safety is important to [place]. If you have drug allergies check your prescription carefully.   For the next 24 hours you can use MyChart to ask questions about today's visit, request a non-urgent call back, or ask for a work or school excuse. You will get an email in the next two days asking about your experience. I hope that your e-visit has been valuable and will speed your recovery.

## 2023-04-06 ENCOUNTER — Encounter (HOSPITAL_BASED_OUTPATIENT_CLINIC_OR_DEPARTMENT_OTHER): Payer: Self-pay | Admitting: *Deleted

## 2023-04-12 ENCOUNTER — Encounter (HOSPITAL_BASED_OUTPATIENT_CLINIC_OR_DEPARTMENT_OTHER): Payer: Self-pay | Admitting: Family Medicine

## 2023-04-17 ENCOUNTER — Encounter (HOSPITAL_BASED_OUTPATIENT_CLINIC_OR_DEPARTMENT_OTHER): Payer: Self-pay | Admitting: *Deleted

## 2023-05-17 ENCOUNTER — Ambulatory Visit (HOSPITAL_BASED_OUTPATIENT_CLINIC_OR_DEPARTMENT_OTHER): Payer: 59 | Admitting: Family Medicine

## 2023-05-17 ENCOUNTER — Encounter (HOSPITAL_BASED_OUTPATIENT_CLINIC_OR_DEPARTMENT_OTHER): Payer: Self-pay

## 2023-07-09 IMAGING — CT CT HEAD W/O CM
3 series · 15 of 47 positions shown, 18 images · non-contrast
Comparison: None.

CLINICAL DATA: Headache, new or worsening.

EXAM:
CT HEAD WITHOUT CONTRAST
TECHNIQUE: Contiguous axial images were obtained from the base of the skull
through the vertex without intravenous contrast.

[Series 3: head wo · axial · 0.43mm/px · z∈[-59,+66]mm · 9 of 31 slices shown, 12 images]
[im 3/31  brain]
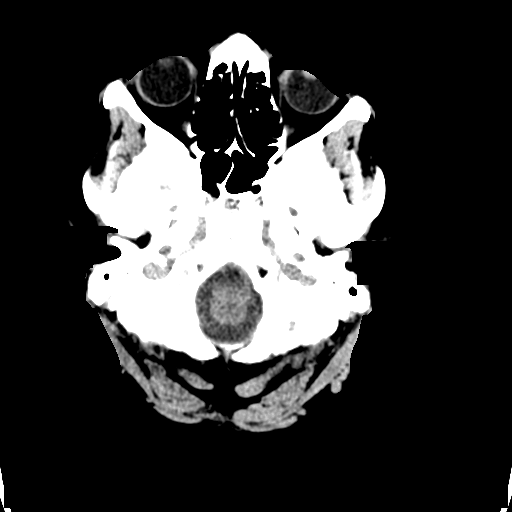
[im 3/31  bone]
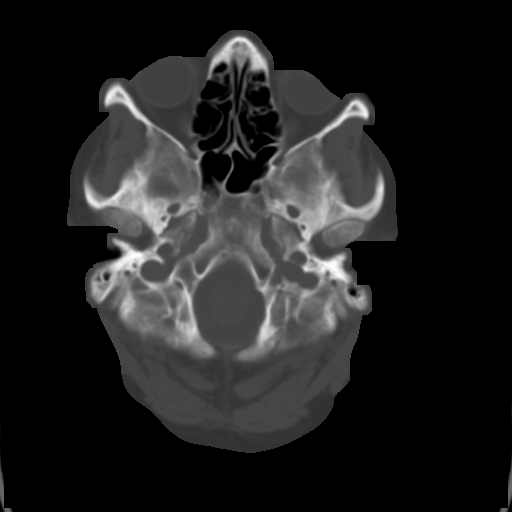
[im 6/31  brain]
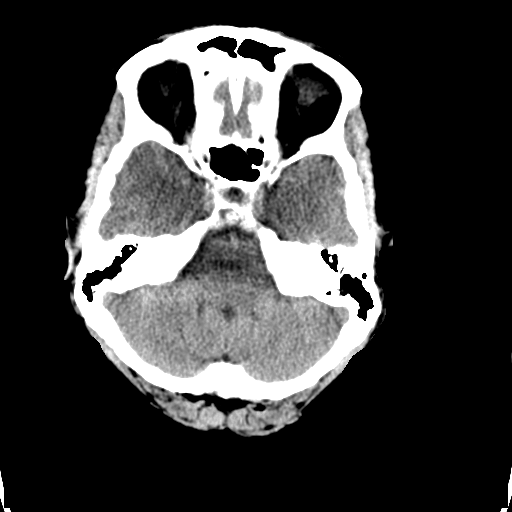
[im 9/31  brain]
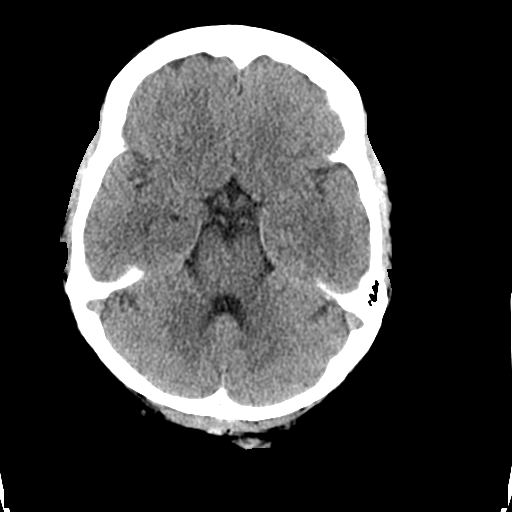
[im 12/31  brain]
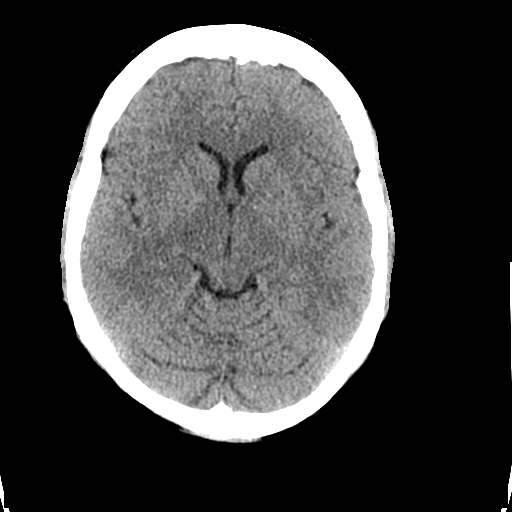
[im 16/31  brain]
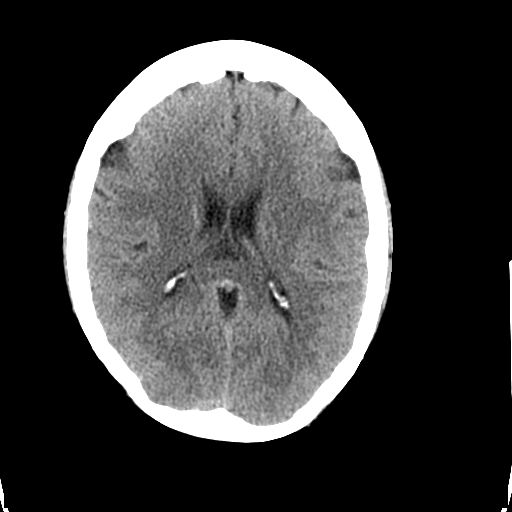
[im 16/31  bone]
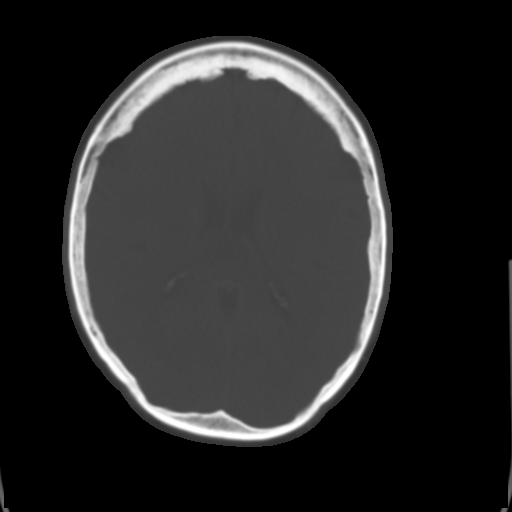
[im 19/31  brain]
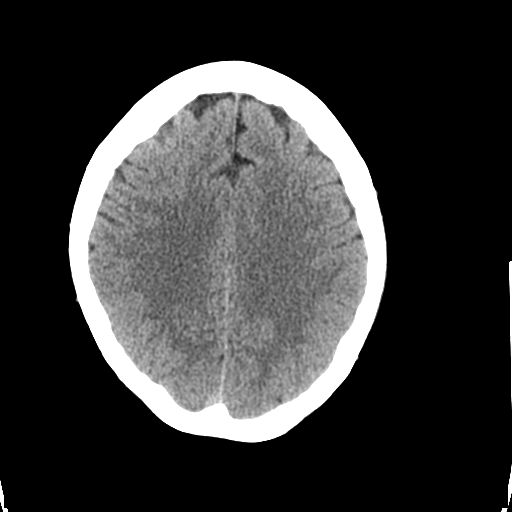
[im 22/31  brain]
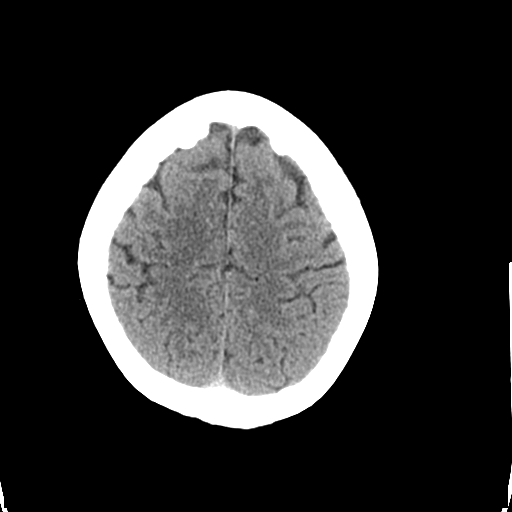
[im 25/31  brain]
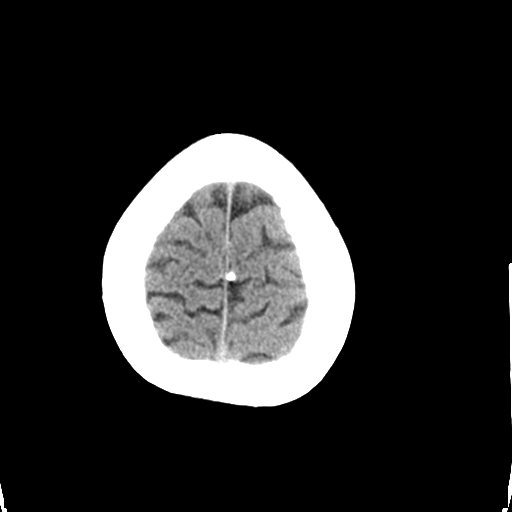
[im 28/31  brain]
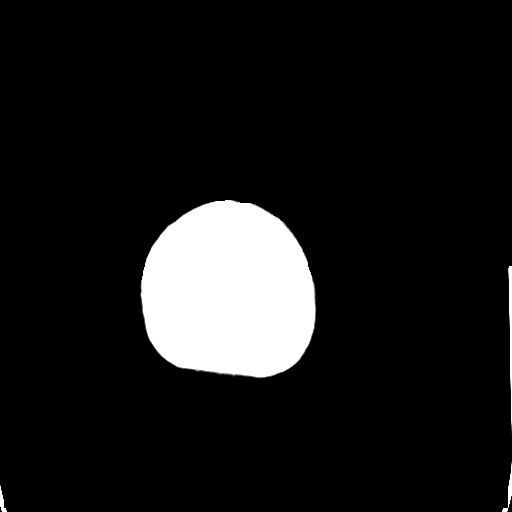
[im 28/31  bone]
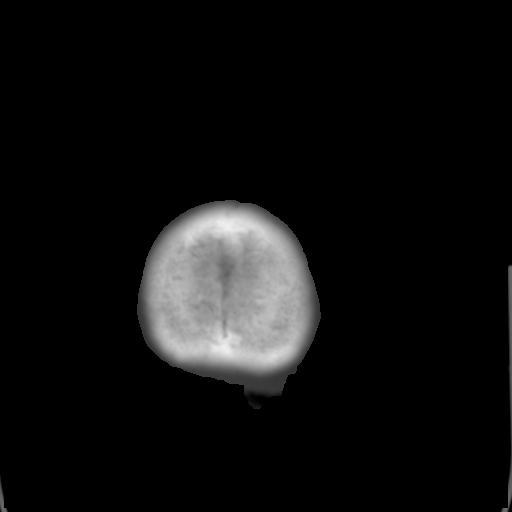

[Series 4: coronal soft tissue · coronal · 0.30mm/px · 3 of 72 slices shown]
[im 24/72  brain]
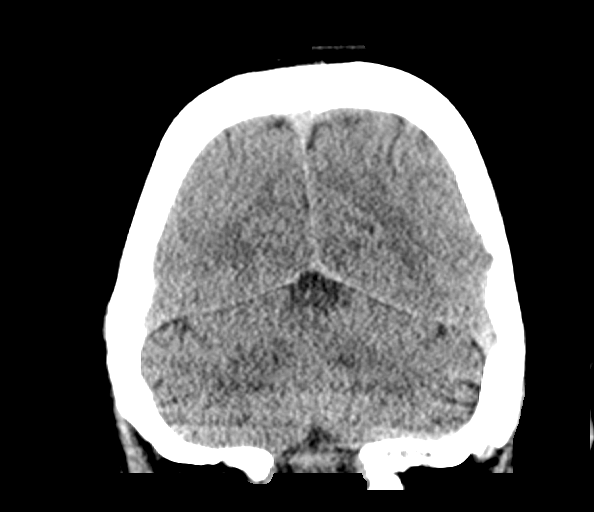
[im 32/72  brain]
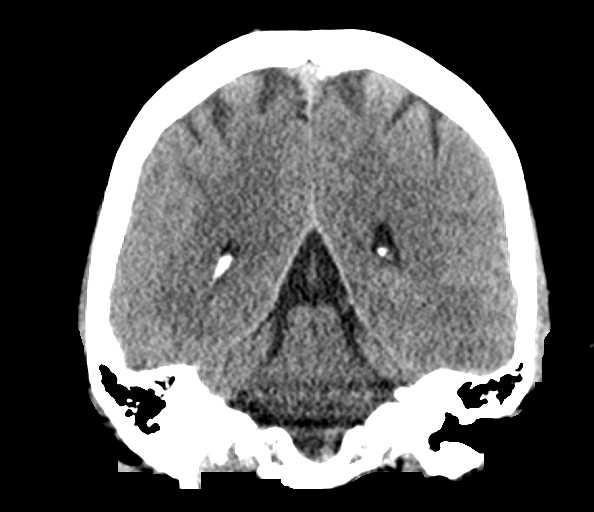
[im 40/72  brain]
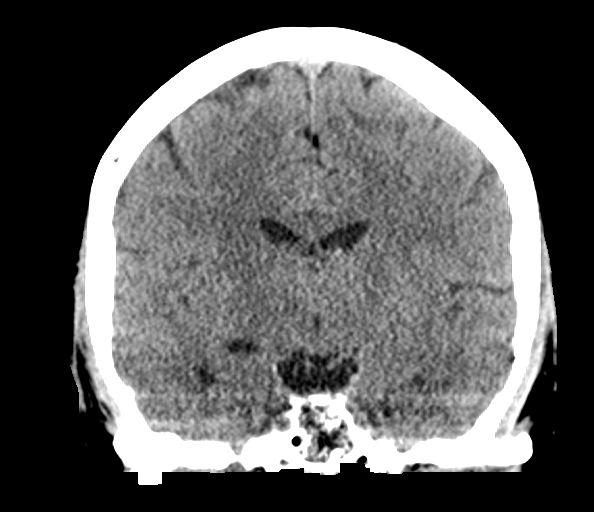

[Series 5: sagittal soft tissue · sagittal · 0.31mm/px · 3 of 72 slices shown]
[im 24/72  brain]
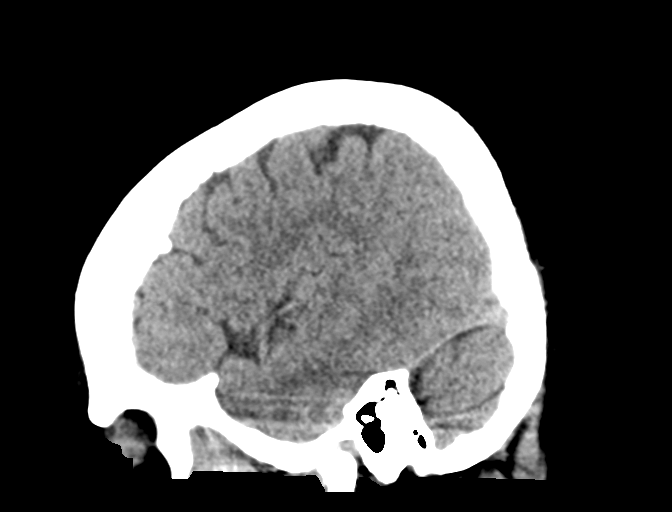
[im 36/72  brain]
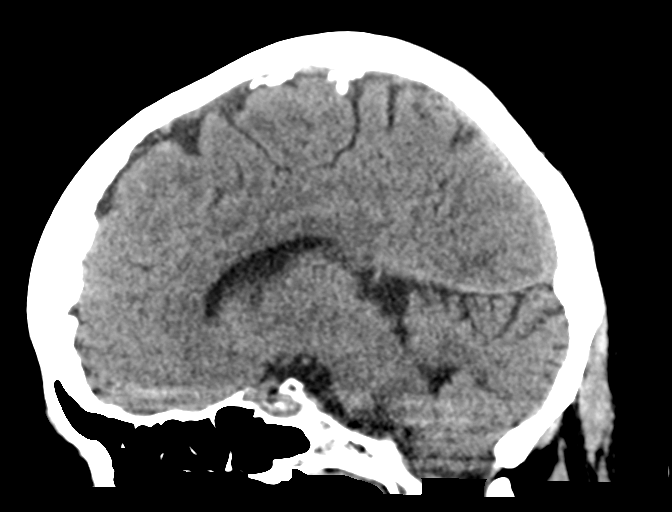
[im 48/72  brain]
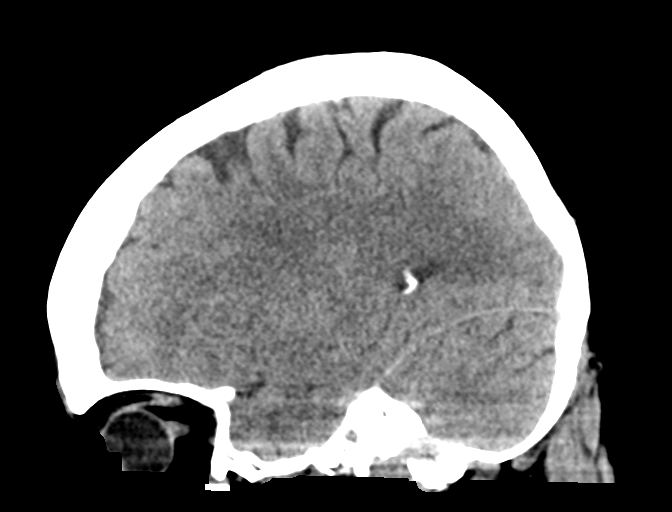

[15 of 47 positions shown; findings below may reference images not displayed]

FINDINGS: Brain: No acute intracranial hemorrhage, midline shift or mass
effect. No extra-axial fluid collection. Gray-white matter
differentiation is within normal limits and there is no
hydrocephalus.

Vascular: No hyperdense vessel or unexpected calcification.

Skull: Normal. Negative for fracture or focal lesion.

Sinuses/Orbits: No acute finding.

Other: None.
IMPRESSION: No acute intracranial process.

## 2024-01-18 ENCOUNTER — Encounter (HOSPITAL_BASED_OUTPATIENT_CLINIC_OR_DEPARTMENT_OTHER): Payer: Self-pay

## 2024-03-22 ENCOUNTER — Ambulatory Visit (HOSPITAL_BASED_OUTPATIENT_CLINIC_OR_DEPARTMENT_OTHER): Payer: 59 | Admitting: Certified Nurse Midwife

## 2024-03-26 ENCOUNTER — Ambulatory Visit (HOSPITAL_BASED_OUTPATIENT_CLINIC_OR_DEPARTMENT_OTHER): Payer: Self-pay | Admitting: Obstetrics and Gynecology
